# Patient Record
Sex: Male | Born: 1990 | Race: Black or African American | Hispanic: No | Marital: Single | Smoking: Never smoker
Health system: Southern US, Community
[De-identification: ages and names within clinical notes are randomized; demographics above are authoritative.]

## PROBLEM LIST (undated history)

## (undated) ENCOUNTER — Ambulatory Visit: Admission: EM

## (undated) DIAGNOSIS — R519 Headache, unspecified: Secondary | ICD-10-CM

## (undated) HISTORY — PX: EYE SURGERY: SHX253

## (undated) HISTORY — PX: HERNIA REPAIR: SHX51

---
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
## 2012-03-11 NOTE — ED Provider Notes (Addendum)
Medical screening examination/treatment/procedure(s) were conducted as a shared visit with non-physician practitioner(s) and myself.  I personally evaluated the patient during the encounter  See my additional note.  Glynn Octave, MD 03/11/12 1478  Glynn Octave, MD 03/11/12 [phone]

## 2018-10-23 ENCOUNTER — Ambulatory Visit (INDEPENDENT_AMBULATORY_CARE_PROVIDER_SITE_OTHER)
Admission: EM | Admit: 2018-10-23 | Discharge: 2018-10-23 | Disposition: A | Discharge status: Home / Self Care | Payer: BC Managed Care – PPO | Source: Home / Self Care

## 2018-10-23 ENCOUNTER — Encounter (HOSPITAL_COMMUNITY): Payer: Self-pay | Admitting: Emergency Medicine

## 2018-10-23 ENCOUNTER — Other Ambulatory Visit: Payer: Self-pay

## 2018-10-23 ENCOUNTER — Emergency Department (HOSPITAL_COMMUNITY)
Admission: EM | Admit: 2018-10-23 | Discharge: 2018-10-23 | Disposition: A | Discharge status: Home / Self Care | Payer: BC Managed Care – PPO | Attending: Emergency Medicine | Admitting: Emergency Medicine

## 2018-10-23 ENCOUNTER — Emergency Department (HOSPITAL_COMMUNITY): Payer: BC Managed Care – PPO

## 2018-10-23 DIAGNOSIS — R9431 Abnormal electrocardiogram [ECG] [EKG]: Secondary | ICD-10-CM

## 2018-10-23 DIAGNOSIS — R0989 Other specified symptoms and signs involving the circulatory and respiratory systems: Secondary | ICD-10-CM | POA: Diagnosis not present

## 2018-10-23 DIAGNOSIS — R079 Chest pain, unspecified: Secondary | ICD-10-CM | POA: Diagnosis not present

## 2018-10-23 DIAGNOSIS — K219 Gastro-esophageal reflux disease without esophagitis: Secondary | ICD-10-CM | POA: Insufficient documentation

## 2018-10-23 DIAGNOSIS — R0789 Other chest pain: Secondary | ICD-10-CM | POA: Insufficient documentation

## 2018-10-23 LAB — CBC
HCT: 45 % (ref 39.0–52.0)
Hemoglobin: 15.5 g/dL (ref 13.0–17.0)
MCH: 27.4 pg (ref 26.0–34.0)
MCHC: 34.4 g/dL (ref 30.0–36.0)
MCV: 79.6 fL — ABNORMAL LOW (ref 80.0–100.0)
Platelets: 234 10*3/uL (ref 150–400)
RBC: 5.65 MIL/uL (ref 4.22–5.81)
RDW: 11.5 % (ref 11.5–15.5)
WBC: 5.6 10*3/uL (ref 4.0–10.5)
nRBC: 0 % (ref 0.0–0.2)

## 2018-10-23 LAB — BASIC METABOLIC PANEL
Anion gap: 10 (ref 5–15)
BUN: 11 mg/dL (ref 6–20)
CO2: 22 mmol/L (ref 22–32)
Calcium: 9.3 mg/dL (ref 8.9–10.3)
Chloride: 102 mmol/L (ref 98–111)
Creatinine, Ser: 1.19 mg/dL (ref 0.61–1.24)
GFR calc Af Amer: >60 mL/min (ref >60)
GFR calc non Af Amer: >60 mL/min (ref >60)
Glucose, Bld: 88 mg/dL (ref 70–99)
Potassium: 4.1 mmol/L (ref 3.5–5.1)
Sodium: 134 mmol/L — ABNORMAL LOW (ref 135–145)

## 2018-10-23 LAB — TROPONIN I (HIGH SENSITIVITY)
Troponin I (High Sensitivity): 6 ng/L (ref <18)
Troponin I (High Sensitivity): 6 ng/L (ref <18)

## 2018-10-23 MED ORDER — SODIUM CHLORIDE 0.9% FLUSH: 3.0000 mL | Freq: Once | INTRAVENOUS | Status: DC

## 2018-10-23 MED ORDER — ALUM & MAG HYDROXIDE-SIMETH 400-400-40 MG/5ML PO SUSP: 5.0000 mL | Freq: Four times a day (QID) | ORAL | 0 refills | Status: DC | PRN

## 2018-10-23 MED ORDER — ALUM & MAG HYDROXIDE-SIMETH 200-200-20 MG/5ML PO SUSP
30.0000 mL | Freq: Once | ORAL | Status: AC
  Administered 2018-10-23: 30 mL via ORAL
  Filled 2018-10-23: qty 30

## 2018-10-23 MED ORDER — LIDOCAINE VISCOUS HCL 2 % MT SOLN
15.0000 mL | Freq: Once | OROMUCOSAL | Status: AC
  Administered 2018-10-23: 15 mL via ORAL
  Filled 2018-10-23: qty 15

## 2018-10-23 NOTE — Discharge Instructions (Signed)
Patient transported to ER for further evaluation via EMS in stable condition.

## 2018-10-23 NOTE — ED Notes (Signed)
EMS notified for transfer to ED for further evaluation

## 2018-10-23 NOTE — ED Provider Notes (Signed)
[facility]    CSN: [id number] Arrival date & time: 10/23/18  1447     History   Chief Complaint Chief Complaint  Patient presents with  . Chest Pain    HPI Daniel Golden is a 28 y.o. male with history of premature birth (6 months gestation), ventral hernia, GERD presenting for 3-day course of constant chest pain, dyspnea, diaphoresis, nausea, heart racing, fatigue without exacerbating or alleviating factors.  Patient has not been taking his GERD medications as prescribed and has been having some reflux as well.  Patient denies alcohol or tobacco use, endorses infrequent marijuana use.  Patient denies similar episodes previous to this, family history of cardiopulmonary disease.  Patient has been taking Benadryl and allergy medications daily for seasonal allergies.  Denies recent illness, fever.    History reviewed. No pertinent past medical history.  There are no active problems to display for this patient.   History reviewed. No pertinent surgical history.     Home Medications    Prior to Admission medications   Medication Sig Start Date End Date Taking? Authorizing Provider  omeprazole (PRILOSEC) 20 MG capsule Take 20 mg by mouth daily.   Yes [provider]  acetaminophen (TYLENOL) 325 MG tablet Take 650 mg by mouth every 6 (six) hours as needed. Pain    [provider]  ibuprofen (ADVIL,MOTRIN) 800 MG tablet Take 1 tablet (800 mg total) by mouth 3 (three) times daily. 12/23/11   Oliveri, Tia L, PA-C  ondansetron (ZOFRAN) 4 MG tablet Take 1 tablet (4 mg total) by mouth every 8 (eight) hours as needed for nausea. 12/23/11   Kathyrn Lass, PA-C    Family History No family history on file.  Social History Social History   Tobacco Use  . Smoking status: Never Smoker  . Smokeless tobacco: Never Used  Substance Use Topics  . Alcohol use: No  . Drug use: No     Allergies   Patient has no known allergies.   Review of Systems  Review of Systems  Constitutional: Positive for diaphoresis and fatigue. Negative for fever.  Respiratory: Positive for shortness of breath. Negative for cough.   Cardiovascular: Positive for chest pain. Negative for palpitations.  Gastrointestinal: Positive for nausea. Negative for abdominal pain, diarrhea and vomiting.  Musculoskeletal: Negative for arthralgias and myalgias.  Skin: Negative for rash and wound.  Neurological: Positive for weakness. Negative for speech difficulty and headaches.  All other systems reviewed and are negative.    Physical Exam Triage Vital Signs ED Triage Vitals  Enc Vitals Group     BP 10/23/18 1457 (!) 146/102     Pulse Rate 10/23/18 1457 (!) 58     Resp 10/23/18 1457 20     Temp 10/23/18 1457 98.3 F (36.8 C)     Temp Source 10/23/18 1457 Oral     SpO2 10/23/18 1457 98 %     Weight --      Height --      Head Circumference --      Peak Flow --      Pain Score 10/23/18 1458 0     Pain Loc --      Pain Edu? --      Excl. in [place]? --    No data found.  Updated Vital Signs BP (!) 146/102 (BP Location: Left Arm)   Pulse (!) 58   Temp 98.3 F (36.8 C) (Oral)   Resp 20   SpO2 98%  Visual Acuity Right Eye Distance:   Left Eye Distance:   Bilateral Distance:    Right Eye Near:   Left Eye Near:    Bilateral Near:     Physical Exam Constitutional:      General: He is not in acute distress.    Comments: Mildly diaphoretic  HENT:     Head: Normocephalic and atraumatic.  Eyes:     General: No scleral icterus.    Pupils: Pupils are equal, round, and reactive to light.  Neck:     Musculoskeletal: Neck supple.     Thyroid: No thyromegaly.     Vascular: No JVD.     Trachea: No tracheal deviation.  Cardiovascular:     Rate and Rhythm: Normal rate and regular rhythm.     Pulses:          Carotid pulses are 2+ on the right side and 2+ on the left side.      Radial pulses are 2+ on the right side and 2+ on the left side.       Dorsalis  pedis pulses are 2+ on the right side and 2+ on the left side.     Heart sounds: Normal heart sounds.  Pulmonary:     Effort: Pulmonary effort is normal. No respiratory distress.     Breath sounds: Normal breath sounds.  Chest:     Chest wall: No tenderness or crepitus.  Musculoskeletal:     Right lower leg: No edema.     Left lower leg: No edema.  Lymphadenopathy:     Cervical: No cervical adenopathy.  Skin:    General: Skin is warm.     Coloration: Skin is not cyanotic, jaundiced or pale.  Neurological:     General: No focal deficit present.     Mental Status: He is alert and oriented to person, place, and time.  Psychiatric:        Mood and Affect: Mood normal.        Behavior: Behavior normal.      UC Treatments / Results  Labs (all labs ordered are listed, but only abnormal results are displayed) Labs Reviewed - No data to display  EKG   Radiology No results found.  Procedures Procedures (including critical care time)  Medications Ordered in UC Medications - No data to display  Initial Impression / Assessment and Plan / UC Course  I have reviewed the triage vital signs and the nursing notes.  Pertinent labs & imaging results that were available during my care of the patient were reviewed by me and considered in my medical decision making (see chart for details).     1.  Chest pain with abnormal EKG Patient mildly diaphoretic, hypertensive in office.  EKG done in office without previous to compare, reviewed by me and consulted with Wallis BambergMario Mani, PA-C: Normal sinus rhythm with sinus arrhythmia.  No QTC prolongation, though significant ST elevation in lead V1 with T wave inversions in leads II, III, and aVF.  T wave inversions in leads V5, V6.  Patient given 324 mg chewable aspirin, IV access obtained by clinical staff which patient tolerated well.  Patient transported to ER for further cardiac work-up by EMS stable condition. Final Clinical Impressions(s) / UC  Diagnoses   Final diagnoses:  Chest pain, unspecified type  Abnormal EKG     Discharge Instructions     Patient transported to ER for further evaluation via EMS in stable condition.    ED Prescriptions  None     Controlled Substance Prescriptions [place] Controlled Substance Registry consulted? Not Applicable   Shea EvansHall-Potvin, Brittany, [place] JerseyPA-C 10/23/18 1609

## 2018-10-23 NOTE — ED Triage Notes (Signed)
Pt c/o lt sided chest pain with SOB x3 days, [facility] and had [facility] neg [facility] test. States dx'd with acid reflux and allergies

## 2018-10-23 NOTE — ED Notes (Signed)
Patient Alert and oriented to baseline. Stable and ambulatory to baseline. Patient verbalized understanding of the discharge instructions.  Patient belongings were taken by the patient.   

## 2018-10-23 NOTE — Discharge Instructions (Signed)
Take omeprazole daily.  Use maalox as needed for breakthrough pain or before bed.  Avoid spicy, greasy, acidic foods.  There is information about this in the paperwork. Avoid anti-inflammatory such as ibuprofen.  You may take Tylenol as needed for pain. Do not eat or drink 60 minutes prior to laying flat. Follow-up with your primary care doctor as needed for further evaluation of your symptoms. Return to the emergency room with any new, worsening, concerning symptoms.

## 2018-10-23 NOTE — ED Provider Notes (Signed)
[name] Provider Note   CSN: [id number] Arrival date & time: 10/23/18  1614    History   Chief Complaint Chief Complaint  Patient presents with  . Chest Pain    HPI Daniel Golden is a 28 y.o. male resenting for evaluation of chest pain.  Patient states for the past 5 days he has been having intermittent left-sided chest pain.  He describes as a chest tightness it.  Is present when he lays flat, no symptoms during the day or with exertion.  He reports associated feeling like his heart is racing and his palms are sweaty.  Symptoms always resolve when he sits up and drinks water.  He reports associated intermittent globus sensation when eating.  Pt state he has been eating a lot of greasy/fatty foods recently, and often eat just before going to bed. He denies associated fevers, chills, shortness of breath.  No radiation of the pain.  He denies nausea or vomiting.  He denies recent viral symptoms.  He was tested several days ago for coronavirus when his symptoms began, results were negative.  He was given omeprazole, which he took yesterday with improvement of his symptoms, however when his symptoms returned today he sought further evaluation.  He was seen at urgent care where his EKG was abnormal, and thus sent to the ER for further evaluation.  He denies a history of reflux previously.  He has not taken anything for symptoms today.  He denies significant NSAID use.  He denies family cardiac history.  He denies history of hypertension or diabetes.  He denies tobacco use.  He denies recent travel, surgeries, immobilization, history of cancer, history of previous DVT/PE.     HPI  No past medical history on file.  There are no active problems to display for this patient.   Past Surgical History:  Procedure Laterality Date  . EYE SURGERY    . HERNIA REPAIR          Home Medications    Prior to Admission medications   Medication Sig Start  Date End Date Taking? Authorizing Provider  acetaminophen (TYLENOL) 325 MG tablet Take 650 mg by mouth every 6 (six) hours as needed. Pain    [provider]  alum & mag hydroxide-simeth (MAALOX MAX) 400-400-40 MG/5ML suspension Take 5 mLs by mouth every 6 (six) hours as needed for indigestion. 10/23/18   Theressa Piedra, PA-C  ibuprofen (ADVIL,MOTRIN) 800 MG tablet Take 1 tablet (800 mg total) by mouth 3 (three) times daily. 12/23/11   Oliveri, Tia L, PA-C  omeprazole (PRILOSEC) 20 MG capsule Take 20 mg by mouth daily.    [provider]  ondansetron (ZOFRAN) 4 MG tablet Take 1 tablet (4 mg total) by mouth every 8 (eight) hours as needed for nausea. 12/23/11   Riki Sheerliveri, Tia L, PA-C    Family History Family History  Problem Relation Age of Onset  . Diabetes Mother   . Heart failure Mother   . Diabetes Father   . Heart failure Father     Social History Social History   Tobacco Use  . Smoking status: Never Smoker  . Smokeless tobacco: Never Used  Substance Use Topics  . Alcohol use: No  . Drug use: No     Allergies   Patient has no known allergies.   Review of Systems Review of Systems  Cardiovascular: Positive for chest pain (intermittent).  All other systems reviewed and are negative.  Physical Exam Updated Vital Signs BP 121/86   Pulse 62   Temp 98.1 F (36.7 C) (Oral)   Resp 20   Ht 5\' 9"  (1.753 m)   Wt 68 kg   SpO2 100%   BMI 22.15 kg/m   Physical Exam Vitals signs and nursing note reviewed.  Constitutional:      General: He is not in acute distress.    Appearance: He is well-developed.     Comments: Sitting comfortably in the bed in no acute distress  HENT:     Head: Normocephalic and atraumatic.  Eyes:     Conjunctiva/sclera: Conjunctivae normal.     Pupils: Pupils are equal, round, and reactive to light.  Neck:     Musculoskeletal: Normal range of motion and neck supple.  Cardiovascular:     Rate and Rhythm: Normal rate and  regular rhythm.     Pulses: Normal pulses.  Pulmonary:     Effort: Pulmonary effort is normal. No respiratory distress.     Breath sounds: Normal breath sounds. No wheezing.     Comments: Speaking in full sentences.  Clear lung sounds in all fields. Abdominal:     General: There is no distension.     Palpations: Abdomen is soft. There is no mass.     Tenderness: There is no abdominal tenderness. There is no guarding or rebound.  Musculoskeletal: Normal range of motion.     Comments: No leg pain or swelling.  Skin:    General: Skin is warm and dry.     Capillary Refill: Capillary refill takes less than 2 seconds.  Neurological:     Mental Status: He is alert and oriented to person, place, and time.      ED Treatments / Results  Labs (all labs ordered are listed, but only abnormal results are displayed) Labs Reviewed  BASIC METABOLIC PANEL - Abnormal; Notable for the following components:      Result Value   Sodium 134 (*)    All other components within normal limits  CBC - Abnormal; Notable for the following components:   MCV 79.6 (*)    All other components within normal limits  TROPONIN I (HIGH SENSITIVITY)  TROPONIN I (HIGH SENSITIVITY)    EKG None  Radiology Dg Chest 2 View  Result Date: 10/23/2018 CLINICAL DATA:  Chest pain, shortness of breath and diaphoresis. EXAM: CHEST - 2 VIEW COMPARISON:  None. FINDINGS: Heart size is normal. Mediastinal shadows are normal. The lungs are clear. No bronchial thickening. No infiltrate, mass, effusion or collapse. Left hemidiaphragm slightly higher than the right, not likely significant. Pulmonary vascularity is normal. No bony abnormality. IMPRESSION: No active cardiopulmonary disease. Electronically Signed   By: Nelson Chimes M.D.   On: 10/23/2018 16:55    Procedures Procedures (including critical care time)  Medications Ordered in ED Medications  alum & mag hydroxide-simeth (MAALOX/MYLANTA) 200-200-20 MG/5ML suspension 30 mL  (30 mLs Oral Given 10/23/18 1937)    And  lidocaine (XYLOCAINE) 2 % viscous mouth solution 15 mL (15 mLs Oral Given 10/23/18 1937)     Initial Impression / Assessment and Plan / ED Course  I have reviewed the triage vital signs and the nursing notes.  Pertinent labs & imaging results that were available during my care of the patient were reviewed by me and considered in my medical decision making (see chart for details).        Presenting for evaluation of intermittent chest pain, present only when laying  flat.  Physical exam reassuring, he appears nontoxic.  No associated shortness of breath, low suspicion for CHF.  As he also reports a globus sensation and symptoms are worse when lying flat, consider GERD.  Initial work-up reassuring, no leukocytosis.  Hemoglobin stable.  Electrolytes stable.  Troponin 6.  Heart score of 2.  As such, will obtain delta troponin, but patient can likely be discharged if delta is less than 5.  X-ray viewed interpreted by me, no pneumonia, pneumothorax, effusion, cardiomegaly.  EKG reviewed by myself and Dr. Deretha EmoryZackowski.  Likely early re-pol/isolated ST changes in V1.   Repeat troponin remains 6.  Patient given GI cocktail, and bed laid flat.  No recurrence of symptoms.  As such, likely GI etiology.  Discussed with patient.  Discussed importance of diet.  Discussed use of omeprazole and Maalox as needed.  Encourage follow-up with primary care as needed for further evaluation.  At this time, patient appears safe for discharge.  Return precautions given.  Patient states he understands and agrees to plan.   Final Clinical Impressions(s) / ED Diagnoses   Final diagnoses:  Atypical chest pain  Gastroesophageal reflux disease, esophagitis presence not specified    ED Discharge Orders         Ordered    alum & mag hydroxide-simeth (MAALOX MAX) 400-400-40 MG/5ML suspension  Every 6 hours PRN     10/23/18 2153           Alveria ApleyCaccavale, Lex Linhares, PA-C 10/24/18 0133     Vanetta MuldersZackowski, Scott, MD 10/27/18 [phone]

## 2018-10-23 NOTE — ED Triage Notes (Signed)
Came in from urgent care; being seen for CP; and reported has some EKG abnormalities.EMS reported relief from NTG  X 1as well.

## 2018-10-25 ENCOUNTER — Encounter (HOSPITAL_COMMUNITY): Payer: Self-pay | Admitting: Emergency Medicine

## 2018-10-25 ENCOUNTER — Other Ambulatory Visit: Payer: Self-pay

## 2018-10-25 ENCOUNTER — Emergency Department (HOSPITAL_COMMUNITY)
Admission: EM | Admit: 2018-10-25 | Discharge: 2018-10-25 | Disposition: A | Discharge status: Home / Self Care | Payer: BC Managed Care – PPO | Attending: Emergency Medicine | Admitting: Emergency Medicine

## 2018-10-25 DIAGNOSIS — K2970 Gastritis, unspecified, without bleeding: Secondary | ICD-10-CM | POA: Insufficient documentation

## 2018-10-25 DIAGNOSIS — R079 Chest pain, unspecified: Secondary | ICD-10-CM | POA: Diagnosis present

## 2018-10-25 MED ORDER — FAMOTIDINE 20 MG PO TABS: 20.0000 mg | ORAL_TABLET | Freq: Two times a day (BID) | ORAL | 0 refills | Status: DC

## 2018-10-25 MED ORDER — FAMOTIDINE 20 MG PO TABS
20.0000 mg | ORAL_TABLET | Freq: Once | ORAL | Status: AC
  Administered 2018-10-25: 16:00:00 20 mg via ORAL
  Filled 2018-10-25: qty 1

## 2018-10-25 NOTE — ED Triage Notes (Signed)
Pt. Stated, I have acid reflux, I was here on Saturday for the same and was given medication for the reflux, it  Works sometimes and sometimes not.

## 2018-10-25 NOTE — Discharge Instructions (Addendum)
Please read attached information. If you experience any new or worsening signs or symptoms please return to the emergency room for evaluation. Please follow-up with your primary care provider or specialist as discussed. Please use medication prescribed only as directed and discontinue taking if you have any concerning signs or symptoms.   °

## 2018-10-25 NOTE — ED Provider Notes (Signed)
[name] Provider Note   CSN: [id number] Arrival date & time: 10/25/18  1358    History   Chief Complaint Chief Complaint  Patient presents with  . Gastroesophageal Reflux    HPI Daniel Golden is a 28 y.o. male.     HPI   28 year old male presents today with complaints of chest and abdominal pain.  Patient has a week long history of intermittent chest and abdominal discomfort.  He notes tightness in his chest that was originally left now it is on the right.  He notes that when he wakes up in the middle the night he has racing of his heart.  He denies any indigestion lower abdominal pain nausea or vomiting.  He notes food does not make his symptoms worse.  He notes recent COVID test that was negative.  He was seen in the emergency room 2 days ago where he was given Maalox and Prilosec which improved his symptoms.  He notes he took another dose of both this morning that did not improve his symptoms.  He notes he has not had a bowel movement in several days but is passing gas.  Denies any history DVT or PE.  No personal family cardiac history, no personal history of hypertension and diabetes.  He does not smoke.    History reviewed. No pertinent past medical history.  There are no active problems to display for this patient.   Past Surgical History:  Procedure Laterality Date  . EYE SURGERY    . HERNIA REPAIR         Home Medications    Prior to Admission medications   Medication Sig Start Date End Date Taking? Authorizing Provider  acetaminophen (TYLENOL) 325 MG tablet Take 650 mg by mouth every 6 (six) hours as needed. Pain    [provider]  alum & mag hydroxide-simeth (MAALOX MAX) 400-400-40 MG/5ML suspension Take 5 mLs by mouth every 6 (six) hours as needed for indigestion. 10/23/18   Caccavale, Sophia, PA-C  famotidine (PEPCID) 20 MG tablet Take 1 tablet (20 mg total) by mouth 2 (two) times daily. 10/25/18   Josseline Reddin,  Tinnie GensJeffrey, PA-C  ibuprofen (ADVIL,MOTRIN) 800 MG tablet Take 1 tablet (800 mg total) by mouth 3 (three) times daily. 12/23/11   Oliveri, Tia L, PA-C  omeprazole (PRILOSEC) 20 MG capsule Take 20 mg by mouth daily.    [provider]  ondansetron (ZOFRAN) 4 MG tablet Take 1 tablet (4 mg total) by mouth every 8 (eight) hours as needed for nausea. 12/23/11   Riki Sheerliveri, Tia L, PA-C    Family History Family History  Problem Relation Age of Onset  . Diabetes Mother   . Heart failure Mother   . Diabetes Father   . Heart failure Father     Social History Social History   Tobacco Use  . Smoking status: Never Smoker  . Smokeless tobacco: Never Used  Substance Use Topics  . Alcohol use: No  . Drug use: No     Allergies   Patient has no known allergies.   Review of Systems Review of Systems  All other systems reviewed and are negative.   Physical Exam Updated Vital Signs BP (!) 145/93 (BP Location: Right Arm)   Pulse 83   Temp 98.5 F (36.9 C) (Oral)   Resp 18   Ht 5\' 9"  (1.753 m)   Wt 68 kg   SpO2 100%   BMI 22.15 kg/m   Physical Exam  Vitals signs and nursing note reviewed.  Constitutional:      Appearance: He is well-developed.  HENT:     Head: Normocephalic and atraumatic.  Eyes:     General: No scleral icterus.       Right eye: No discharge.        Left eye: No discharge.     Conjunctiva/sclera: Conjunctivae normal.     Pupils: Pupils are equal, round, and reactive to light.  Neck:     Musculoskeletal: Normal range of motion.     Vascular: No JVD.     Trachea: No tracheal deviation.  Pulmonary:     Effort: Pulmonary effort is normal.     Breath sounds: No stridor.  Neurological:     Mental Status: He is alert and oriented to person, place, and time.     Coordination: Coordination normal.  Psychiatric:        Behavior: Behavior normal.        Thought Content: Thought content normal.        Judgment: Judgment normal.     ED Treatments / Results   Labs (all labs ordered are listed, but only abnormal results are displayed) Labs Reviewed - No data to display  EKG None  Radiology Dg Chest 2 View  Result Date: 10/23/2018 CLINICAL DATA:  Chest pain, shortness of breath and diaphoresis. EXAM: CHEST - 2 VIEW COMPARISON:  None. FINDINGS: Heart size is normal. Mediastinal shadows are normal. The lungs are clear. No bronchial thickening. No infiltrate, mass, effusion or collapse. Left hemidiaphragm slightly higher than the right, not likely significant. Pulmonary vascularity is normal. No bony abnormality. IMPRESSION: No active cardiopulmonary disease. Electronically Signed   By: Nelson Chimes M.D.   On: 10/23/2018 16:55    Procedures Procedures (including critical care time)  Medications Ordered in ED Medications  famotidine (PEPCID) tablet 20 mg (20 mg Oral Given 10/25/18 1546)     Initial Impression / Assessment and Plan / ED Course  I have reviewed the triage vital signs and the nursing notes.  Pertinent labs & imaging results that were available during my care of the patient were reviewed by me and considered in my medical decision making (see chart for details).        Assessment/Plan: 28 year old male presents today with indigestion and chest pain.  He said weeklong symptoms, had recent cardiac work-up with no significant abnormalities.  Very low suspicion for ACS, dissection, PE, infection or any other life-threatening etiology.  Patient does have symptoms consistent with anxiety as well.  I do have some suspicion for indigestion as his symptoms improved with Prilosec and Maalox previously.  Patient will be instructed to use Pepcid, if this does not work he will continue using Prilosec, follow-up as an outpatient.  Is given strict return precautions.  Verbalized understanding and agreement to today's plan had no further questions or concerns at time of discharge.   Final Clinical Impressions(s) / ED Diagnoses   Final diagnoses:   Gastritis without bleeding, unspecified chronicity, unspecified gastritis type    ED Discharge Orders         Ordered    famotidine (PEPCID) 20 MG tablet  2 times daily     10/25/18 1546           Okey Regal, PA-C 10/25/18 [place], [place], DO 10/26/18 [phone]

## 2018-10-25 NOTE — ED Notes (Signed)
Patient verbalizes understanding of discharge instructions. Opportunity for questioning and answers were provided. Armband removed by staff, pt discharged from ED.  

## 2018-10-27 ENCOUNTER — Telehealth: Payer: Self-pay | Admitting: Emergency Medicine

## 2018-10-27 NOTE — Telephone Encounter (Signed)
Patient called the medication question.  He states he went to the ER from urgent care: Diagnosed with GERD.  Patient went to ER few days later after symptoms not resolving with just omeprazole.  Patient states he was given famotidine and Maalox at the second appointment.  Patient is concerned that taking all 3 might cause an allergic reaction.  Patient states that his discomfort has dissipated since using all 3.  Provided reassurance to patient satisfaction that there should not be an allergic reaction, that he will not "overdose "as he had voiced concerned.  Patient has a PCP, will call today to schedule an appointment for ER follow-up.

## 2018-10-28 ENCOUNTER — Other Ambulatory Visit: Payer: Self-pay

## 2018-10-28 ENCOUNTER — Observation Stay (HOSPITAL_COMMUNITY)
Admission: EM | Admit: 2018-10-28 | Discharge: 2018-10-29 | Disposition: A | Discharge status: Home / Self Care | Payer: BC Managed Care – PPO | Attending: Cardiovascular Disease | Admitting: Cardiovascular Disease

## 2018-10-28 ENCOUNTER — Encounter (HOSPITAL_COMMUNITY): Payer: Self-pay | Admitting: *Deleted

## 2018-10-28 ENCOUNTER — Observation Stay (HOSPITAL_BASED_OUTPATIENT_CLINIC_OR_DEPARTMENT_OTHER): Payer: BC Managed Care – PPO

## 2018-10-28 DIAGNOSIS — R079 Chest pain, unspecified: Secondary | ICD-10-CM | POA: Diagnosis not present

## 2018-10-28 DIAGNOSIS — R9431 Abnormal electrocardiogram [ECG] [EKG]: Secondary | ICD-10-CM

## 2018-10-28 DIAGNOSIS — F1210 Cannabis abuse, uncomplicated: Secondary | ICD-10-CM | POA: Diagnosis not present

## 2018-10-28 DIAGNOSIS — Z8249 Family history of ischemic heart disease and other diseases of the circulatory system: Secondary | ICD-10-CM | POA: Insufficient documentation

## 2018-10-28 DIAGNOSIS — R002 Palpitations: Secondary | ICD-10-CM | POA: Diagnosis present

## 2018-10-28 DIAGNOSIS — K219 Gastro-esophageal reflux disease without esophagitis: Secondary | ICD-10-CM | POA: Insufficient documentation

## 2018-10-28 DIAGNOSIS — Z87891 Personal history of nicotine dependence: Secondary | ICD-10-CM | POA: Diagnosis not present

## 2018-10-28 DIAGNOSIS — Z1159 Encounter for screening for other viral diseases: Secondary | ICD-10-CM | POA: Insufficient documentation

## 2018-10-28 DIAGNOSIS — I471 Supraventricular tachycardia: Secondary | ICD-10-CM | POA: Diagnosis not present

## 2018-10-28 LAB — CBC WITH DIFFERENTIAL/PLATELET
Abs Immature Granulocytes: 0.01 10*3/uL (ref 0.00–0.07)
Basophils Absolute: 0 10*3/uL (ref 0.0–0.1)
Basophils Relative: 1 %
Eosinophils Absolute: 0 10*3/uL (ref 0.0–0.5)
Eosinophils Relative: 1 %
HCT: 51 % (ref 39.0–52.0)
Hemoglobin: 16.8 g/dL (ref 13.0–17.0)
Immature Granulocytes: 0 %
Lymphocytes Relative: 25 %
Lymphs Abs: 1.3 10*3/uL (ref 0.7–4.0)
MCH: 27.4 pg (ref 26.0–34.0)
MCHC: 32.9 g/dL (ref 30.0–36.0)
MCV: 83.1 fL (ref 80.0–100.0)
Monocytes Absolute: 0.7 10*3/uL (ref 0.1–1.0)
Monocytes Relative: 14 %
Neutro Abs: 3.2 10*3/uL (ref 1.7–7.7)
Neutrophils Relative %: 59 %
Platelets: 266 10*3/uL (ref 150–400)
RBC: 6.14 MIL/uL — ABNORMAL HIGH (ref 4.22–5.81)
RDW: 11.8 % (ref 11.5–15.5)
WBC: 5.3 10*3/uL (ref 4.0–10.5)
nRBC: 0 % (ref 0.0–0.2)

## 2018-10-28 LAB — BASIC METABOLIC PANEL
Anion gap: 17 — ABNORMAL HIGH (ref 5–15)
BUN: 12 mg/dL (ref 6–20)
CO2: 22 mmol/L (ref 22–32)
Calcium: 9.9 mg/dL (ref 8.9–10.3)
Chloride: 100 mmol/L (ref 98–111)
Creatinine, Ser: 1.28 mg/dL — ABNORMAL HIGH (ref 0.61–1.24)
GFR calc Af Amer: >60 mL/min (ref >60)
GFR calc non Af Amer: >60 mL/min (ref >60)
Glucose, Bld: 69 mg/dL — ABNORMAL LOW (ref 70–99)
Potassium: 4.6 mmol/L (ref 3.5–5.1)
Sodium: 139 mmol/L (ref 135–145)

## 2018-10-28 LAB — RAPID URINE DRUG SCREEN, HOSP PERFORMED
Amphetamines: NOT DETECTED
Barbiturates: NOT DETECTED
Benzodiazepines: NOT DETECTED
Cocaine: NOT DETECTED
Opiates: NOT DETECTED
Tetrahydrocannabinol: POSITIVE — AB

## 2018-10-28 LAB — CBC
HCT: 46 % (ref 39.0–52.0)
Hemoglobin: 15.6 g/dL (ref 13.0–17.0)
MCH: 27.7 pg (ref 26.0–34.0)
MCHC: 33.9 g/dL (ref 30.0–36.0)
MCV: 81.6 fL (ref 80.0–100.0)
Platelets: 253 10*3/uL (ref 150–400)
RBC: 5.64 MIL/uL (ref 4.22–5.81)
RDW: 11.8 % (ref 11.5–15.5)
WBC: 4.8 10*3/uL (ref 4.0–10.5)
nRBC: 0 % (ref 0.0–0.2)

## 2018-10-28 LAB — TROPONIN I (HIGH SENSITIVITY)
Troponin I (High Sensitivity): 12 ng/L (ref <18)
Troponin I (High Sensitivity): 19 ng/L — ABNORMAL HIGH (ref <18)
Troponin I (High Sensitivity): 18 ng/L — ABNORMAL HIGH (ref <18)

## 2018-10-28 LAB — ECHOCARDIOGRAM COMPLETE
Height: 69 in
Weight: 2398.6 oz

## 2018-10-28 LAB — CREATININE, SERUM
Creatinine, Ser: 1.25 mg/dL — ABNORMAL HIGH (ref 0.61–1.24)
GFR calc Af Amer: >60 mL/min (ref >60)
GFR calc non Af Amer: >60 mL/min (ref >60)

## 2018-10-28 LAB — TSH: TSH: 0.778 u[IU]/mL (ref 0.350–4.500)

## 2018-10-28 LAB — SARS CORONAVIRUS 2 BY RT PCR (HOSPITAL ORDER, PERFORMED IN ~~LOC~~ HOSPITAL LAB): SARS Coronavirus 2: NEGATIVE

## 2018-10-28 LAB — D-DIMER, QUANTITATIVE: D-Dimer, Quant: <0.27 ug/mL-FEU (ref 0.00–0.50)

## 2018-10-28 MED ORDER — SODIUM CHLORIDE 0.9 % IV BOLUS
1000.0000 mL | Freq: Once | INTRAVENOUS | Status: AC
  Administered 2018-10-28: 1000 mL via INTRAVENOUS

## 2018-10-28 MED ORDER — ACETAMINOPHEN 325 MG PO TABS: 650.0000 mg | ORAL_TABLET | ORAL | Status: DC | PRN

## 2018-10-28 MED ORDER — SODIUM CHLORIDE 0.9% FLUSH
3.0000 mL | Freq: Two times a day (BID) | INTRAVENOUS | Status: DC
  Administered 2018-10-28 – 2018-10-29 (×2): 3 mL via INTRAVENOUS

## 2018-10-28 MED ORDER — ONDANSETRON HCL 4 MG/2ML IJ SOLN: 4.0000 mg | Freq: Four times a day (QID) | INTRAMUSCULAR | Status: DC | PRN

## 2018-10-28 MED ORDER — PANTOPRAZOLE SODIUM 40 MG PO TBEC
40.0000 mg | DELAYED_RELEASE_TABLET | Freq: Every day | ORAL | Status: DC
  Administered 2018-10-29: 40 mg via ORAL
  Filled 2018-10-28: qty 1

## 2018-10-28 MED ORDER — ASPIRIN 81 MG PO CHEW
81.0000 mg | CHEWABLE_TABLET | ORAL | Status: AC
  Administered 2018-10-29: 81 mg via ORAL
  Filled 2018-10-28: qty 1

## 2018-10-28 MED ORDER — ASPIRIN EC 81 MG PO TBEC: 81.0000 mg | DELAYED_RELEASE_TABLET | Freq: Every day | ORAL | Status: DC

## 2018-10-28 MED ORDER — SODIUM CHLORIDE 0.9 % IV SOLN: 250.0000 mL | INTRAVENOUS | Status: DC | PRN

## 2018-10-28 MED ORDER — METOPROLOL TARTRATE 25 MG PO TABS
25.0000 mg | ORAL_TABLET | Freq: Two times a day (BID) | ORAL | Status: DC
  Administered 2018-10-28: 25 mg via ORAL
  Filled 2018-10-28: qty 1

## 2018-10-28 MED ORDER — SODIUM CHLORIDE 0.9% FLUSH: 3.0000 mL | INTRAVENOUS | Status: DC | PRN

## 2018-10-28 MED ORDER — SODIUM CHLORIDE 0.9 % WEIGHT BASED INFUSION
1.0000 mL/kg/h | INTRAVENOUS | Status: DC
  Administered 2018-10-29: 1 mL/kg/h via INTRAVENOUS

## 2018-10-28 MED ORDER — FAMOTIDINE 20 MG PO TABS
20.0000 mg | ORAL_TABLET | Freq: Two times a day (BID) | ORAL | Status: DC
  Administered 2018-10-28 – 2018-10-29 (×2): 20 mg via ORAL
  Filled 2018-10-28 (×2): qty 1

## 2018-10-28 MED ORDER — SODIUM CHLORIDE 0.9 % WEIGHT BASED INFUSION
3.0000 mL/kg/h | INTRAVENOUS | Status: DC
  Administered 2018-10-29: 3 mL/kg/h via INTRAVENOUS

## 2018-10-28 MED ORDER — NITROGLYCERIN 0.4 MG SL SUBL: 0.4000 mg | SUBLINGUAL_TABLET | SUBLINGUAL | Status: DC | PRN

## 2018-10-28 MED ORDER — HEPARIN SODIUM (PORCINE) 5000 UNIT/ML IJ SOLN
5000.0000 [IU] | Freq: Three times a day (TID) | INTRAMUSCULAR | Status: DC
  Administered 2018-10-28 – 2018-10-29 (×2): 5000 [IU] via SUBCUTANEOUS
  Filled 2018-10-28 (×2): qty 1

## 2018-10-28 NOTE — ED Notes (Signed)
Attempted to call report.  Nurse unavailable. 

## 2018-10-28 NOTE — ED Provider Notes (Signed)
[name] Note   CSN: [id number] Arrival date & time: 10/28/18  0903     History   Chief Complaint Chief Complaint  Patient presents with  . Irregular Heart Beat    HPI Lockie Pareshillip P Schow is a 28 y.o. male.     HPI   Patient is a 28 year old male with no significant past medical history presenting for palpitations and central chest pain.  Patient reports that the symptoms all began within the last week.  He has had multiple emergency department visits in the last week due to these ongoing symptoms.  He reports that when his symptoms began a week ago he was having sharp pain in the center of the chest that would be mostly nonexertional and wake him up at night.  He will also feel rapid "thumping" of his heart.  Would feel short of breath at the time but otherwise would not have shortness of breath at other times when not experiencing the symptoms.  He reports the chest pain is improved but he is still waking up with bothersome palpitations in the night.  These episodes make him lightheaded.  He denies nausea or vomiting.  Patient denies any recent illnesses and was tested for COVID-19 a couple weeks ago and it was negative.  Patient denies any history of DVT/PE, hormone use, cancer treatment, recent immobilization, hospitalization, recent surgery, lower extremity edema or calf tenderness or hemoptysis.  Patient has a family history of CAD in his grandfather at age 26 otherwise no heart conditions in the family. Patient reports that he quit cigarettes, marijuana, and drinking [employer] in the last 2 weeks.  History reviewed. No pertinent past medical history.  There are no active problems to display for this patient.   Past Surgical History:  Procedure Laterality Date  . EYE SURGERY    . HERNIA REPAIR          Home Medications    Prior to Admission medications   Medication Sig Start Date End Date Taking? Authorizing Provider   acetaminophen (TYLENOL) 325 MG tablet Take 650 mg by mouth every 6 (six) hours as needed. Pain   Yes [provider]  alum & mag hydroxide-simeth (MAALOX MAX) 400-400-40 MG/5ML suspension Take 5 mLs by mouth every 6 (six) hours as needed for indigestion. 10/23/18  Yes Caccavale, Sophia, PA-C  famotidine (PEPCID) 20 MG tablet Take 1 tablet (20 mg total) by mouth 2 (two) times daily. 10/25/18  Yes Hedges, Tinnie GensJeffrey, PA-C  omeprazole (PRILOSEC) 20 MG capsule Take 20 mg by mouth daily.   Yes [provider]  ZYRTEC-D ALLERGY & CONGESTION 5-120 MG tablet Take 1 tablet by mouth every 12 (twelve) hours. 10/22/18  Yes [provider]  ibuprofen (ADVIL,MOTRIN) 800 MG tablet Take 1 tablet (800 mg total) by mouth 3 (three) times daily. Patient not taking: Reported on 10/28/2018 12/23/11   Pixie Casinoliveri, Tia L, PA-C  ondansetron (ZOFRAN) 4 MG tablet Take 1 tablet (4 mg total) by mouth every 8 (eight) hours as needed for nausea. Patient not taking: Reported on 10/28/2018 12/23/11   Riki Sheerliveri, Tia L, PA-C    Family History Family History  Problem Relation Age of Onset  . Diabetes Mother   . Heart failure Mother   . Diabetes Father   . Heart failure Father     Social History Social History   Tobacco Use  . Smoking status: Never Smoker  . Smokeless tobacco: Never Used  Substance Use Topics  .  Alcohol use: No  . Drug use: No     Allergies   Patient has no known allergies.   Review of Systems Review of Systems  Constitutional: Negative for chills and fever.  HENT: Negative for congestion, rhinorrhea, sinus pain and sore throat.   Eyes: Negative for visual disturbance.  Respiratory: Positive for shortness of breath. Negative for cough and chest tightness.   Cardiovascular: Positive for chest pain and palpitations. Negative for leg swelling.  Gastrointestinal: Negative for abdominal pain, constipation, diarrhea, nausea and vomiting.  Genitourinary: Negative for dysuria and flank pain.   Musculoskeletal: Negative for back pain and myalgias.  Skin: Negative for rash.  Neurological: Positive for light-headedness. Negative for dizziness, syncope and headaches.     Physical Exam Updated Vital Signs BP (!) 142/87   Pulse 84   Temp 98.5 F (36.9 C) (Oral)   Resp 13   SpO2 100%   Physical Exam Vitals signs and nursing note reviewed.  Constitutional:      General: He is not in acute distress.    Appearance: He is well-developed.  HENT:     Head: Normocephalic and atraumatic.  Eyes:     Conjunctiva/sclera: Conjunctivae normal.     Pupils: Pupils are equal, round, and reactive to light.  Neck:     Musculoskeletal: Normal range of motion and neck supple.  Cardiovascular:     Rate and Rhythm: Normal rate and regular rhythm.     Pulses: Normal pulses.     Heart sounds: Normal heart sounds, S1 normal and S2 normal. No murmur.  Pulmonary:     Effort: Pulmonary effort is normal.     Breath sounds: Normal breath sounds. No wheezing or rales.  Abdominal:     General: There is no distension.     Palpations: Abdomen is soft.     Tenderness: There is no abdominal tenderness. There is no guarding.  Musculoskeletal: Normal range of motion.        General: No swelling, tenderness or deformity.     Right lower leg: No edema.     Left lower leg: No edema.  Lymphadenopathy:     Cervical: No cervical adenopathy.  Skin:    General: Skin is warm and dry.     Findings: No erythema or rash.  Neurological:     Mental Status: He is alert.     Comments: Cranial nerves grossly intact. Patient moves extremities symmetrically and with good coordination.  Psychiatric:        Behavior: Behavior normal.        Thought Content: Thought content normal.        Judgment: Judgment normal.      ED Treatments / Results  Labs (all labs ordered are listed, but only abnormal results are displayed) Labs Reviewed  BASIC METABOLIC PANEL - Abnormal; Notable for the following components:       Result Value   Glucose, Bld 69 (*)    Creatinine, Ser 1.28 (*)    Anion gap 17 (*)    All other components within normal limits  CBC WITH DIFFERENTIAL/PLATELET - Abnormal; Notable for the following components:   RBC 6.14 (*)    All other components within normal limits  SARS CORONAVIRUS 2 (HOSPITAL ORDER, PERFORMED IN [facility])  D-DIMER, QUANTITATIVE (NOT AT [facility])  RAPID URINE DRUG SCREEN, HOSP PERFORMED  TROPONIN I (HIGH SENSITIVITY)    EKG EKG Interpretation  Date/Time:  Thursday October 28 2018 13:30:38 EDT Ventricular Rate:  70 PR Interval:    QRS Duration: 87 QT Interval:  387 QTC Calculation: 418 R Axis:   29 Text Interpretation:  Sinus rhythm Borderline short PR interval LVH by voltage similar to prior EKG Confirmed by Malvin Johns [phone]) on 10/28/2018 1:35:59 PM   Radiology No results found.  Procedures Procedures (including critical care time)  Medications Ordered in ED Medications - No data to display   Initial Impression / Assessment and Plan / ED Course  I have reviewed the triage vital signs and the nursing notes.  Pertinent labs & imaging results that were available during my care of the patient were reviewed by me and considered in my medical decision making (see chart for details).  Clinical Course as of Oct 28 1319  Thu Oct 28, 2018  1304 D-Dimer, Quant: <0.27 [AM]  1320 Suspect volume depletion as cause.  Will give fluids.  Patient specifically denies any salicylate use, alcohol use, toxic alcohol use, or other new medications or substances. Co2 is normal and do not suspect AG metabolic acidosis.  Anion gap(!): 17 [AM]    Clinical Course User Index [AM] Albesa Seen, PA-C       This is a well-appearing 28 year old male with no significant past medical history presenting for ongoing palpitations and chest discomfort.  Primarily occur at night and at rest.  Overall presentation atypical for ACS and patient did recently have  chest pain rule out 4 days ago with negative troponins x2.  EKG on initial evaluation was a normal sinus rhythm with some ST depressions inferiorly.  Symptoms are also atypical for dangerous arrhythmias, as they occur not exertionally, patient does not have syncope or near syncope when they occur, has no history of exertional syncope.  Patient was treated for GERD which seems to be improving his symptoms however the palpitations persist.  Given ongoing symptoms, will assess d-dimer.    EKG today shows some slight worsening of the inferior depressions, but overall consistent with prior EKG on 10-23-2018.  Patient is chest pain-free here in the emergency department.  His only complaint is palpitations that he was experiencing at night.  D-dimer is negative.  Patient does have slight elevation in creatinine from a few days ago.  Cardiology was consulted for abnormal EKG. patient seen by Dr. Johnsie Cancel of cardiology who recommends stat echocardiogram and admission for further managment.They will admit the patient.  I appreciate his involvement.  Final Clinical Impressions(s) / ED Diagnoses   Final diagnoses:  Palpitations  Abnormal EKG    ED Discharge Orders    None       Tamala Julian 10/28/18 1710    Malvin Johns, MD 10/28/18 1717

## 2018-10-28 NOTE — ED Triage Notes (Signed)
Pt reports being seen for acid reflux. States that he has been taking the medications but now feels like his heart is racing. Pt denies any pain.

## 2018-10-28 NOTE — Progress Notes (Signed)
  Echocardiogram 2D Echocardiogram has been performed.  Daniel Golden 10/28/2018, 5:08 PM

## 2018-10-28 NOTE — ED Notes (Signed)
Attempted to call report, nurse unavailable.

## 2018-10-28 NOTE — H&P (Addendum)
Cardiology Consultation:   Patient ID: Daniel Golden MRN: [id number]; DOB: 1991/01/20  Admit date: 10/28/2018 Date of Consult: 10/28/2018  Primary Care Provider: Patient, No Pcp Per Primary Cardiologist: New (Dr. Johnsie Cancel)   Patient Profile:   Daniel Golden is a 28 y.o. male with no significant past medical history except recent diagnosis of GERD who is being seen today for the evaluation of palpitation and chest pain at the request of Dr. Tamera Punt.  No prior cardiac history.  Patient was born prematurely at 67-month  No developmental delay.  Paternal grandfather had CABG at 762  Paternal uncle died at age 26 unknown cause.  History of Present Illness:   Mr. CWulfwas diagnosed with GERD week ago after to ER visit.  He was having left-sided sharp chest pain.  He also reports having palpitation since then.  Noted mostly when calm and laying down.  He reports resolution of chest pain since taking Protonix and Pepcid.  However his palpitation continued.  He does not drink any caffeinated product since last week.  He stopped smoking tobacco and marijuana.  He denies dizziness, shortness of breath, orthopnea, PND, syncope or lower extremity edema.  No regular exercise.  Last night at 4:30 AM he woke up with worse episode of palpitation which lasted for few hours and gradually improved.  He came to ER for further evaluation.  Upon arrival his blood pressure was elevated at 145/104 at heart rate of 96 bpm.  Patient had a brief episode of sinus tachycardia at 130s at 12:30 PM.  He did felt palpitation at that time.  Serum creatinine 1.28.  Negative d-dimer.  He reports history of syncope one time after donating plasma.  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  Heart Pathway Score:     History reviewed. No pertinent past medical history.  Past Surgical History:  Procedure Laterality Date  . EYE SURGERY    . HERNIA REPAIR       Inpatient Medications: Scheduled Meds:  Continuous  Infusions:  PRN Meds:   Allergies:   No Known Allergies  Social History:   Social History   Socioeconomic History  . Marital status: Single    Spouse name: Not on file  . Number of children: Not on file  . Years of education: Not on file  . Highest education level: Not on file  Occupational History  . Not on file  Social Needs  . Financial resource strain: Not on file  . Food insecurity    Worry: Not on file    Inability: Not on file  . Transportation needs    Medical: Not on file    Non-medical: Not on file  Tobacco Use  . Smoking status: Never Smoker  . Smokeless tobacco: Never Used  Substance and Sexual Activity  . Alcohol use: No  . Drug use: No  . Sexual activity: Not on file  Lifestyle  . Physical activity    Days per week: Not on file    Minutes per session: Not on file  . Stress: Not on file  Relationships  . Social [place] phone: Not on file    Gets together: Not on file    Attends religious service: Not on file    Active member of club or organization: Not on file    Attends meetings of clubs or organizations: Not on file    Relationship status: Not on file  . Intimate partner violence    Fear  of current or ex partner: Not on file    Emotionally abused: Not on file    Physically abused: Not on file    Forced sexual activity: Not on file  Other Topics Concern  . Not on file  Social History Narrative  . Not on file    Family History:   Family History  Problem Relation Age of Onset  . Diabetes Mother   . Heart failure Mother   . Diabetes Father   . Heart failure Father      ROS:  Please see the history of present illness.  All other ROS reviewed and negative.     Physical Exam/Data:   Vitals:   10/28/18 1200 10/28/18 1327 10/28/18 1330 10/28/18 1400  BP: 127/77  134/83 130/79  Pulse: 75  69 62  Resp: _0 Temp:      TempSrc:      SpO2: 100%  100% 100%  Weight:  68 kg    Height:  _1  (1.753 m)     No  intake or output data in the 24 hours ending 10/28/18 1448 Last 3 Weights 10/28/2018 10/25/2018 10/23/2018  Weight (lbs) 149 lb 14.6 oz 150 lb 150 lb  Weight (kg) 68 kg 68.04 kg 68.04 kg     Body mass index is 22.14 kg/m.  General: Thin young male in no acute distress HEENT: normal Lymph: no adenopathy Neck: no JVD Endocrine:  No thryomegaly Vascular: No carotid bruits; FA pulses 2+ bilaterally without bruits  Cardiac:  normal S1, S2; RRR; no murmur  Lungs:  clear to auscultation bilaterally, no wheezing, rhonchi or rales  Abd: soft, nontender, no hepatomegaly  Ext: no edema Musculoskeletal:  No deformities, BUE and BLE strength normal and equal Skin: warm and dry  Neuro:  CNs 2-12 intact, no focal abnormalities noted Psych:  Normal affect   EKG:  The EKG was personally reviewed and demonstrates: Sinus rhythm at rate of 70 bpm, short PR interval,  T wave inversion in inferior lateral leads with ST depression which is more pronounced than last week telemetry:  Telemetry was personally reviewed and demonstrates: Sinus rhythm at rate mostly in 60 to 70s with episode of sinus tachycardia at 130 bpm  Relevant CV Studies: As above  Laboratory Data:  High Sensitivity Troponin:   Recent Labs  Lab 10/23/18 1625 10/23/18 1822  TROPONINIHS 6 6      Chemistry Recent Labs  Lab 10/23/18 1625 10/28/18 1200  NA 134* 139  K 4.1 4.6  CL 102 100  CO2 22 22  GLUCOSE 88 69*  BUN 11 12  CREATININE 1.19 1.28*  CALCIUM 9.3 9.9  GFRNONAA >60 >60  GFRAA >60 >60  ANIONGAP 10 17*    Hematology Recent Labs  Lab 10/23/18 1625 10/28/18 1200  WBC 5.6 5.3  RBC 5.65 6.14*  HGB 15.5 16.8  HCT 45.0 51.0  MCV 79.6* 83.1  MCH 27.4 27.4  MCHC 34.4 32.9  RDW 11.5 11.8  PLT 234 266    DDimer  Recent Labs  Lab 10/28/18 1200  DDIMER <0.27    Radiology/Studies:  No results found.  Assessment and Plan:   1. Chest pain - Resolved after started taking Pepcid and Nexium.  Likely related  to GERD. EKG with worsen T wave inversion with ST depression in interior lateral leads compared to last week. Check troponin. Stat echo. Start BB. COVID test. D-dimer negative. Admit and plan cardiac cath tomorrow.   2.  Palpitation - Ongoing for past 1 week.  Worse episode last night.    Brief episode of sinus tachycardia this afternoon. Monitor on tele. Add BB.   3. Transient elevation of BP - His BP was 145/93 on 8/3 - Today it was 145/104 on arrival, now improved to 130/79. - Add BB as above. Follow closely   4. Tobacco and marijuana use - Reports noted used since last week - Denies cocaine use - Check UDS  For questions or updates, please contact [place] Please consult [email or web address] for contact info under     Jarrett Soho, [place]  10/28/2018 2:48 PM   Patient examined chart reviewed Discussed care with patient, mother and Pa. Exam with thin black male Some developmental delay No murmur clear lungs soft abdomen good peripheral pulses with no edema. ECG abnormal with transient worsening of ST depression in inferior alteral leads. No ST elevation or PR depression . Voltage for LVH vs thin person. Palpitations sound benign History of drug use. Start lopressor. Unfortunately the cardiac CT scanner is down and don't know when it will be back up and there are multiple cases pending. Will get stat echo to r/o congenital heart disease, effusion, HOCM, or RWMAls. CXR NAD normal mediastinum.  Add ESR and troponin to labs. Admit to telemetry Will arrange cath for am to r/o CAD Risks including bleeding , stroke , contrast reaction, renal injury or need for emergency surgery discussed Mother and patient willing to proceed. Check UDS given admission of marijuana use make sure no cocaine that could be stimulating BP, HR and chest pain   Jenkins Rouge

## 2018-10-29 ENCOUNTER — Encounter (HOSPITAL_COMMUNITY)
Admission: EM | Disposition: A | Discharge status: Home / Self Care | Payer: Self-pay | Source: Home / Self Care | Attending: Emergency Medicine

## 2018-10-29 ENCOUNTER — Encounter (HOSPITAL_COMMUNITY): Payer: Self-pay | Admitting: Internal Medicine

## 2018-10-29 ENCOUNTER — Other Ambulatory Visit: Payer: Self-pay

## 2018-10-29 ENCOUNTER — Other Ambulatory Visit: Payer: Self-pay | Admitting: Physician Assistant

## 2018-10-29 ENCOUNTER — Telehealth: Payer: Self-pay | Admitting: Radiology

## 2018-10-29 DIAGNOSIS — I471 Supraventricular tachycardia: Secondary | ICD-10-CM

## 2018-10-29 DIAGNOSIS — R9431 Abnormal electrocardiogram [ECG] [EKG]: Secondary | ICD-10-CM | POA: Diagnosis not present

## 2018-10-29 DIAGNOSIS — R079 Chest pain, unspecified: Secondary | ICD-10-CM

## 2018-10-29 DIAGNOSIS — R0789 Other chest pain: Secondary | ICD-10-CM | POA: Diagnosis not present

## 2018-10-29 DIAGNOSIS — Z1159 Encounter for screening for other viral diseases: Secondary | ICD-10-CM | POA: Diagnosis not present

## 2018-10-29 DIAGNOSIS — F1210 Cannabis abuse, uncomplicated: Secondary | ICD-10-CM | POA: Diagnosis not present

## 2018-10-29 DIAGNOSIS — R002 Palpitations: Secondary | ICD-10-CM | POA: Diagnosis not present

## 2018-10-29 HISTORY — PX: LEFT HEART CATH AND CORONARY ANGIOGRAPHY: CATH118249

## 2018-10-29 LAB — BASIC METABOLIC PANEL
Anion gap: 13 (ref 5–15)
BUN: 10 mg/dL (ref 6–20)
CO2: 22 mmol/L (ref 22–32)
Calcium: 9.3 mg/dL (ref 8.9–10.3)
Chloride: 102 mmol/L (ref 98–111)
Creatinine, Ser: 1.15 mg/dL (ref 0.61–1.24)
GFR calc Af Amer: >60 mL/min (ref >60)
GFR calc non Af Amer: >60 mL/min (ref >60)
Glucose, Bld: 73 mg/dL (ref 70–99)
Potassium: 3.8 mmol/L (ref 3.5–5.1)
Sodium: 137 mmol/L (ref 135–145)

## 2018-10-29 LAB — LIPID PANEL
Cholesterol: 108 mg/dL (ref 0–200)
HDL: 31 mg/dL — ABNORMAL LOW (ref >40)
LDL Cholesterol: 66 mg/dL (ref 0–99)
Total CHOL/HDL Ratio: 3.5 RATIO
Triglycerides: 53 mg/dL (ref <150)
VLDL: 11 mg/dL (ref 0–40)

## 2018-10-29 LAB — HIV ANTIBODY (ROUTINE TESTING W REFLEX): HIV Screen 4th Generation wRfx: NONREACTIVE

## 2018-10-29 SURGERY — LEFT HEART CATH AND CORONARY ANGIOGRAPHY
Anesthesia: LOCAL

## 2018-10-29 MED ORDER — NITROGLYCERIN 1 MG/10 ML FOR IR/CATH LAB
INTRA_ARTERIAL | Status: DC | PRN
  Administered 2018-10-29: 100 ug via INTRACORONARY

## 2018-10-29 MED ORDER — VERAPAMIL HCL 2.5 MG/ML IV SOLN
INTRAVENOUS | Status: AC
  Filled 2018-10-29: qty 2

## 2018-10-29 MED ORDER — MIDAZOLAM HCL 2 MG/2ML IJ SOLN
INTRAMUSCULAR | Status: DC | PRN
  Administered 2018-10-29: 1 mg via INTRAVENOUS

## 2018-10-29 MED ORDER — METOPROLOL TARTRATE 25 MG PO TABS
25.0000 mg | ORAL_TABLET | Freq: Three times a day (TID) | ORAL | Status: DC
  Administered 2018-10-29 (×2): 25 mg via ORAL
  Filled 2018-10-29 (×2): qty 1

## 2018-10-29 MED ORDER — SODIUM CHLORIDE 0.9% FLUSH: 3.0000 mL | INTRAVENOUS | Status: DC | PRN

## 2018-10-29 MED ORDER — LIDOCAINE HCL (PF) 1 % IJ SOLN
INTRAMUSCULAR | Status: AC
  Filled 2018-10-29: qty 30

## 2018-10-29 MED ORDER — NITROGLYCERIN 1 MG/10 ML FOR IR/CATH LAB
INTRA_ARTERIAL | Status: AC
  Filled 2018-10-29: qty 10

## 2018-10-29 MED ORDER — SODIUM CHLORIDE 0.9 % IV SOLN: 250.0000 mL | INTRAVENOUS | Status: DC | PRN

## 2018-10-29 MED ORDER — METOPROLOL TARTRATE 25 MG PO TABS: 25.0000 mg | ORAL_TABLET | Freq: Two times a day (BID) | ORAL | 3 refills | Status: DC

## 2018-10-29 MED ORDER — SODIUM CHLORIDE 0.9 % IV SOLN: INTRAVENOUS | Status: AC

## 2018-10-29 MED ORDER — HEPARIN SODIUM (PORCINE) 1000 UNIT/ML IJ SOLN
INTRAMUSCULAR | Status: DC | PRN
  Administered 2018-10-29: 3000 [IU] via INTRAVENOUS

## 2018-10-29 MED ORDER — HEPARIN (PORCINE) IN NACL 1000-0.9 UT/500ML-% IV SOLN
INTRAVENOUS | Status: DC | PRN
  Administered 2018-10-29 (×2): 500 mL

## 2018-10-29 MED ORDER — FENTANYL CITRATE (PF) 100 MCG/2ML IJ SOLN
INTRAMUSCULAR | Status: AC
  Filled 2018-10-29: qty 2

## 2018-10-29 MED ORDER — VERAPAMIL HCL 2.5 MG/ML IV SOLN
INTRAVENOUS | Status: DC | PRN
  Administered 2018-10-29: 10 mL via INTRA_ARTERIAL

## 2018-10-29 MED ORDER — HYDRALAZINE HCL 20 MG/ML IJ SOLN: 10.0000 mg | INTRAMUSCULAR | Status: DC | PRN

## 2018-10-29 MED ORDER — LIDOCAINE HCL (PF) 1 % IJ SOLN
INTRAMUSCULAR | Status: DC | PRN
  Administered 2018-10-29: 2 mL via INTRADERMAL

## 2018-10-29 MED ORDER — IOHEXOL 350 MG/ML SOLN
INTRAVENOUS | Status: DC | PRN
  Administered 2018-10-29: 12:00:00 30 mL via INTRA_ARTERIAL

## 2018-10-29 MED ORDER — FENTANYL CITRATE (PF) 100 MCG/2ML IJ SOLN
INTRAMUSCULAR | Status: DC | PRN
  Administered 2018-10-29: 25 ug via INTRAVENOUS

## 2018-10-29 MED ORDER — SODIUM CHLORIDE 0.9% FLUSH
3.0000 mL | Freq: Two times a day (BID) | INTRAVENOUS | Status: DC
  Administered 2018-10-29: 3 mL via INTRAVENOUS

## 2018-10-29 MED ORDER — HEPARIN (PORCINE) IN NACL 1000-0.9 UT/500ML-% IV SOLN
INTRAVENOUS | Status: AC
  Filled 2018-10-29: qty 1000

## 2018-10-29 MED ORDER — MIDAZOLAM HCL 2 MG/2ML IJ SOLN
INTRAMUSCULAR | Status: AC
  Filled 2018-10-29: qty 2

## 2018-10-29 MED ORDER — LABETALOL HCL 5 MG/ML IV SOLN: 10.0000 mg | INTRAVENOUS | Status: DC | PRN

## 2018-10-29 MED ORDER — HEPARIN SODIUM (PORCINE) 5000 UNIT/ML IJ SOLN: 5000.0000 [IU] | Freq: Three times a day (TID) | INTRAMUSCULAR | Status: DC

## 2018-10-29 SURGICAL SUPPLY — 10 items
CATH INFINITI 5 FR MPA2 (CATHETERS) ×1 IMPLANT
CATH INFINITI JR4 5F (CATHETERS) ×1 IMPLANT
DEVICE RAD TR BAND REGULAR (VASCULAR PRODUCTS) ×1 IMPLANT
GUIDEWIRE INQWIRE 1.5J.035X260 (WIRE) IMPLANT
INQWIRE 1.5J .035X260CM (WIRE) ×2
KIT HEART LEFT (KITS) ×2 IMPLANT
PACK CARDIAC CATHETERIZATION (CUSTOM PROCEDURE TRAY) ×2 IMPLANT
SHEATH RAIN RADIAL 21G 6FR (SHEATH) ×1 IMPLANT
TRANSDUCER W/STOPCOCK (MISCELLANEOUS) ×2 IMPLANT
TUBING CIL FLEX 10 FLL-RA (TUBING) ×2 IMPLANT

## 2018-10-29 NOTE — Progress Notes (Addendum)
Progress Note  Patient Name: Daniel Golden Date of Encounter: 10/29/2018  Primary Cardiologist:Dr. Eden EmmsNishan   Subjective   No chest pain. Felt palpitations last nights.   Inpatient Medications    Scheduled Meds: . aspirin EC  81 mg Oral Daily  . famotidine  20 mg Oral BID  . heparin  5,000 Units Subcutaneous Q8H  . metoprolol tartrate  25 mg Oral BID  . pantoprazole  40 mg Oral Daily  . sodium chloride flush  3 mL Intravenous Q12H   Continuous Infusions: . sodium chloride    . sodium chloride 1 mL/kg/hr (10/29/18 0610)   PRN Meds: sodium chloride, acetaminophen, nitroGLYCERIN, ondansetron (ZOFRAN) IV, sodium chloride flush   Vital Signs    Vitals:   10/28/18 2157 10/29/18 0041 10/29/18 0432 10/29/18 0515  BP: 109/74 104/64 118/79   Pulse: 76 63 61   Resp:  20 20   Temp:  (!) 97.3 F (36.3 C) 98.8 F (37.1 C)   TempSrc:  Oral Oral   SpO2: 100% 100% 99%   Weight:    63.5 kg  Height:        Intake/Output Summary (Last 24 hours) at 10/29/2018 0814 Last data filed at 10/29/2018 0653 Gross per 24 hour  Intake 1703.13 ml  Output 400 ml  Net 1303.13 ml   Last 3 Weights 10/29/2018 10/28/2018 10/28/2018  Weight (lbs) 140 lb 140 lb 3.4 oz 149 lb 14.6 oz  Weight (kg) 63.504 kg 63.6 kg 68 kg      Telemetry    SR with ST depression in 60.s, intermittent tachycardia 100-200s- Personally Reviewed  ECG    SR with ST depression interiorly, resolved TWI in laterally - Personally Reviewed  Physical Exam   GEN: No acute distress.   Neck: No JVD Cardiac: RRR, no murmurs, rubs, or gallops.  Respiratory: Clear to auscultation bilaterally. GI: Soft, nontender, non-distended  MS: No edema; No deformity. Neuro:  Nonfocal  Psych: Normal affect   Labs    High Sensitivity Troponin:   Recent Labs  Lab 10/23/18 1625 10/23/18 1822 10/28/18 1524 10/28/18 1846 10/28/18 1940  TROPONINIHS 6 6 12  19* 18*       Chemistry Recent Labs  Lab 10/23/18 1625 10/28/18 1200  10/28/18 1846 10/29/18 0425  NA 134* 139  --  137  K 4.1 4.6  --  3.8  CL 102 100  --  102  CO2 22 22  --  22  GLUCOSE 88 69*  --  73  BUN 11 12  --  10  CREATININE 1.19 1.28* 1.25* 1.15  CALCIUM 9.3 9.9  --  9.3  GFRNONAA >60 >60 >60 >60  GFRAA >60 >60 >60 >60  ANIONGAP 10 17*  --  13     Hematology Recent Labs  Lab 10/23/18 1625 10/28/18 1200 10/28/18 1846  WBC 5.6 5.3 4.8  RBC 5.65 6.14* 5.64  HGB 15.5 16.8 15.6  HCT 45.0 51.0 46.0  MCV 79.6* 83.1 81.6  MCH 27.4 27.4 27.7  MCHC 34.4 32.9 33.9  RDW 11.5 11.8 11.8  PLT 234 266 253    BNPNo results for input(s): BNP, PROBNP in the last 168 hours.   DDimer  Recent Labs  Lab 10/28/18 1200  DDIMER <0.27     Radiology    No results found.  Cardiac Studies   Echo 10/28/18  1. The left ventricle has normal systolic function with an ejection fraction of 60-65%. The cavity size was normal. Left ventricular diastolic  parameters were normal.  2. The right ventricle has normal systolic function. The cavity was normal.  3. The tricuspid valve is grossly normal.  4. The aortic valve was not well visualized. No stenosis of the aortic valve.  5. The aorta is normal in size and structure.  6. Technically difficult; normal LV function.  Patient Profile     Daniel Golden is a 28 y.o. male with no significant past medical history except recent diagnosis of GERD seen for chest pain and palpitations.   Assessment & Plan    1. Chest pain  - Resolved after initiation of PPI and pepcid.   2. Abnormal EKG - TWI resolved in lateral leads on repeat EKG today. For cath later todat  3. Tachycardia - review of tele shows SVT when going in 190-200s and atrial tachycardia when above 100s - Increase metoprolol to 25mg  TID - Consult ER  4. Tobacco and marijuana use -UDS positive for marijuana - Recommended cessation   5. Transient BP elevation  - Now normalized   For questions or updates, please contact [name]  Please consult [email or web address] for contact info under        SignedLeanor Kail, PA  10/29/2018, 8:14 AM    Patient examined chart reviewed. Telemetry with multiple bouts of significant SVT/ATrial tachycardia associated with ST depression For cath today to r/o CAD or congenital abnormality in origins since he has ECG changes and chest pain with symptoms.  Arrhythmia precludes cardiac CT And scanner has been down. Continue beta blocker TTE poor quality study but no obvious structural heart dx.  Will ask EP to see would appear to be a good ablative candidate  Jenkins Rouge

## 2018-10-29 NOTE — Interval H&P Note (Signed)
History and Physical Interval Note:  10/29/2018 11:12 AM  Daniel Golden  has presented today for cardiac catheterization, with the diagnosis of chest pain.  The various methods of treatment have been discussed with the patient and family. After consideration of risks, benefits and other options for treatment, the patient has consented to  Procedure(s): LEFT HEART CATH AND CORONARY ANGIOGRAPHY (N/A) as a surgical intervention.  The patient's history has been reviewed, patient examined, no change in status, stable for surgery.  I have reviewed the patient's chart and labs.  Questions were answered to the patient's satisfaction.    Cath Lab Visit (complete for each Cath Lab visit)  Clinical Evaluation Leading to the Procedure:   ACS: Yes.   (unstable angina and SVT)  Non-ACS:  N/A  Taneka Espiritu

## 2018-10-29 NOTE — Discharge Summary (Signed)
Discharge Summary    Patient ID: Daniel Golden MRN: [id number]; DOB: 1991-01-22  Admit date: 10/28/2018 Discharge date: 10/29/2018  Primary Care Provider: Patient, No Pcp Per  Primary Cardiologist: Dr. Eden EmmsNishan  Primary Electrophysiologist:  Dr. Elberta Fortisamnitz  Discharge Diagnoses    Principal Problem:   SVT (supraventricular tachycardia) [facility](HCC) Active Problems:   Palpitations   Chest pain of uncertain etiology   Abnormal EKG   Marijuana abuse  Allergies No Known Allergies  Diagnostic Studies/Procedures    LEFT HEART CATH AND CORONARY ANGIOGRAPHY 10/29/2018  Conclusion  Conclusions: 1. Mild tapering of the apical LAD, which improves with intracoronary nitroglycerin suggestive of an element of vasospasm.  There is no angiographically significant atherosclerotic coronary artery disease. 2. Coronary arteries arise from expected locations. 3. Low normal left ventricular filling pressure.  Recommendations: 1. Proceed with electrophysiology evaluation and management of supraventricular tachycardia. 2. Primary prevention of coronary artery disease.     Echo 10/28/18  1. The left ventricle has normal systolic function with an ejection fraction of 60-65%. The cavity size was normal. Left ventricular diastolic parameters were normal.  2. The right ventricle has normal systolic function. The cavity was normal.  3. The tricuspid valve is grossly normal.  4. The aortic valve was not well visualized. No stenosis of the aortic valve.  5. The aorta is normal in size and structure.  6. Technically difficult; normal LV function.    History of Present Illness     Daniel Golden is a 28 y.o. male with no significant past medical history except recent diagnosis of GERD who was admitted for the evaluation of palpitation and chest pain.  No prior cardiac history.  Patient was born prematurely at 2943-month.  No developmental delay.  Paternal grandfather had CABG at 5476.  Paternal uncle died at age  105, unknown cause.  Mr. Marcha SoldersCathey was diagnosed with GERD week ago after  ER visit.  He was having left-sided sharp chest pain.  He also reported having palpitation since then.  Noted mostly when calm and laying down.  He reported resolution of chest pain since taking Protonix and Pepcid.  However his palpitation continued.  He does not drink any caffeinated product since last week.  He stopped smoking tobacco and marijuana.  He denied dizziness, shortness of breath, orthopnea, PND, syncope or lower extremity edema.  No regular exercise. He woke up (4:30 AM) with worse episode of palpitation which lasted for few hours and gradually improved.  He came to ER for further evaluation.  Upon arrival his blood pressure was elevated at 145/104 at heart rate of 96 bpm.  Patient had a brief episode of sinus tachycardia at 130s at 12:30 PM.  He did felt palpitation at that time.  Serum creatinine 1.28.  Negative d-dimer. EKG with new TWI with ST depression in interior lateral leads compared to last week.  He reports history of syncope one time after donating plasma.  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  Hospital Course     Consultants: EP  He was admitted for further evaluation. UDS positive for marijuana. Hs-troponin peaked at 19. Repeat EKG in morning showed resolved TWI in lateral leads. Echo showed preserved LV function. This was technically difficult study. Overnight telemetry showed SVT at 170-200s. He was evaluated by EP. Recommended continued BB. He underwent cardiac cath for abnormal EKG ( Unable to get coronary CT as scanner was down and tachycardia) which showed normal coronaries and vasospasm. See cath report. He will continue BB  and outpatient 14 days monitor. Follow up with Dr. Elberta Fortisamnitz later this month for ablation evaluation.   The patient been seen by Dr. Eden EmmsNishan today and deemed ready for discharge home. All follow-up appointments have been scheduled. Discharge medications are listed below.     Discharge Vitals Blood pressure 114/79, pulse 60, temperature 98.8 F (37.1 C), temperature source Oral, resp. rate 15, height 5\' 9"  (1.753 m), weight 63.5 kg, SpO2 100 %.  Filed Weights   10/28/18 1327 10/28/18 1800 10/29/18 0515  Weight: 68 kg 63.6 kg 63.5 kg    Labs & Radiologic Studies    CBC Recent Labs    10/28/18 1200 10/28/18 1846  WBC 5.3 4.8  NEUTROABS 3.2  --   HGB 16.8 15.6  HCT 51.0 46.0  MCV 83.1 81.6  PLT 266 253   Basic Metabolic Panel Recent Labs    16/12/9606/06/20 1200 10/28/18 1846 10/29/18 0425  NA 139  --  137  K 4.6  --  3.8  CL 100  --  102  CO2 22  --  22  GLUCOSE 69*  --  73  BUN 12  --  10  CREATININE 1.28* 1.25* 1.15  CALCIUM 9.9  --  9.3   D-Dimer Recent Labs    10/28/18 1200  DDIMER <0.27   Fasting Lipid Panel Recent Labs    10/29/18 0425  CHOL 108  HDL 31*  LDLCALC 66  TRIG 53  CHOLHDL 3.5   Thyroid Function Tests Recent Labs    10/28/18 1846  TSH 0.778   _____________  Dg Chest 2 View  Result Date: 10/23/2018 CLINICAL DATA:  Chest pain, shortness of breath and diaphoresis. EXAM: CHEST - 2 VIEW COMPARISON:  None. FINDINGS: Heart size is normal. Mediastinal shadows are normal. The lungs are clear. No bronchial thickening. No infiltrate, mass, effusion or collapse. Left hemidiaphragm slightly higher than the right, not likely significant. Pulmonary vascularity is normal. No bony abnormality. IMPRESSION: No active cardiopulmonary disease. Electronically Signed   By: Paulina FusiMark  Shogry M.D.   On: 10/23/2018 16:55   Disposition   Pt is being discharged home today in good condition.  Follow-up Plans & Appointments    Follow-up Information    Regan Lemmingamnitz, Will Martin, MD Follow up on 11/19/2018.   Specialty: Cardiology Why: at 330 for post hospital follow up. To discuss SVT ablation Contact information: [address] [place] [id number] [phone]          Discharge Instructions    Ambulatory referral to  Cardiology   Complete by: As directed    Diet - low sodium heart healthy   Complete by: As directed    Discharge instructions   Complete by: As directed    No driving for 48 hours. No lifting over 5 lbs for 1 week. No sexual activity for 1 week. You may return to work on 11/01/18. Keep procedure site clean & dry. If you notice increased pain, swelling, bleeding or pus, call/return!  You may shower, but no soaking baths/hot tubs/pools for 1 week.   Increase activity slowly   Complete by: As directed       Discharge Medications   Allergies as of 10/29/2018   No Known Allergies     Medication List    STOP taking these medications   ondansetron 4 MG tablet Commonly known as: Zofran     TAKE these medications   acetaminophen 325 MG tablet Commonly known as: TYLENOL Take 650 mg by mouth  every 6 (six) hours as needed. Pain   alum & mag hydroxide-simeth 161-096-04 MG/5ML suspension Commonly known as: Maalox Max Take 5 mLs by mouth every 6 (six) hours as needed for indigestion.   famotidine 20 MG tablet Commonly known as: PEPCID Take 1 tablet (20 mg total) by mouth 2 (two) times daily.   ibuprofen 800 MG tablet Commonly known as: ADVIL Take 1 tablet (800 mg total) by mouth 3 (three) times daily.   metoprolol tartrate 25 MG tablet Commonly known as: LOPRESSOR Take 1 tablet (25 mg total) by mouth 2 (two) times daily.   omeprazole 20 MG capsule Commonly known as: PRILOSEC Take 20 mg by mouth daily.   ZyrTEC-D Allergy & Congestion 5-120 MG tablet Generic drug: cetirizine-pseudoephedrine Take 1 tablet by mouth every 12 (twelve) hours.        Acute coronary syndrome (MI, NSTEMI, STEMI, etc) this admission?: No.    Outstanding Labs/Studies   Monitor as outpatient  Duration of Discharge Encounter   Greater than 30 minutes including physician time.  Signed, [name], PA 10/29/2018, 3:52 PM

## 2018-10-29 NOTE — Consult Note (Addendum)
ELECTROPHYSIOLOGY CONSULT NOTE    Patient ID: Daniel Golden MRN: [id number], DOB/AGE: [birth year] 28 y.o.  Admit date: 10/28/2018 Date of Consult: 10/29/2018  Primary Physician: Patient, No Pcp Per Primary Cardiologist: (New) Dr. Eden EmmsNishan Electrophysiologist: (New) Dr. Elberta Fortisamnitz  Referring Provider: Dr. Eden EmmsNishan  Patient Profile: Daniel Golden is a [age] y.o. male with a history of GERD who is being seen today for the evaluation of SVT/palpitations at the request of Dr. Eden EmmsNishan.  HPI:  Daniel Golden is a 28 y.o. male with recently diagnosed GERD who presented with left-sided sharp chest pain associated with palpitations. He could feel them most when he was quiet and lying down. Chest pain resolved with taking protonix and pepcid.  Palpitations have continued since ER visit last week. No caffeine x 1 week. He also stopped tobacco and THC.   He has not had dizziness, SOB, orthopnea, PND, syncope, or peripheral edema. No ETOH use. He woke up at 0430 m 10/28/2018 with significant palpitations that lasted hours.   Echo 10/28/2018 showed normal LVEF 60-65% and no obvious structural abnormalities.    He is feeling good this am. He has had palpitations every night for the past 7-10 days. It initially improved with treatment of his GERD, but has since worsened again. He denies family history of sudden cardiac death. 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  He denies ETOH use. He drinks a fair amount of caffeine, but has stopped completely since last week. Denies exertional chest pain, orthopnea, dyspnea on exertion, or peripheral edema.   Medical History GERD (Diagnosed 10/2018)  Surgical History:  Past Surgical History:  Procedure Laterality Date  . EYE SURGERY    . HERNIA REPAIR       Medications Prior to Admission  Medication Sig Dispense Refill Last Dose  . acetaminophen (TYLENOL) 325 MG tablet Take 650 mg by mouth every 6 (six) hours as needed. Pain   10/26/2018 at prn  .  alum & mag hydroxide-simeth (MAALOX MAX) 400-400-40 MG/5ML suspension Take 5 mLs by mouth every 6 (six) hours as needed for indigestion. 355 mL 0 10/26/2018 at prn  . famotidine (PEPCID) 20 MG tablet Take 1 tablet (20 mg total) by mouth 2 (two) times daily. 30 tablet 0 10/27/2018 at Unknown time  . omeprazole (PRILOSEC) 20 MG capsule Take 20 mg by mouth daily.   10/28/2018 at Unknown time  . ZYRTEC-D ALLERGY & CONGESTION 5-120 MG tablet Take 1 tablet by mouth every 12 (twelve) hours.   10/27/2018 at Unknown time  . ibuprofen (ADVIL,MOTRIN) 800 MG tablet Take 1 tablet (800 mg total) by mouth 3 (three) times daily. (Patient not taking: Reported on 10/28/2018) 21 tablet 0 Not Taking at Unknown time    Inpatient Medications:  . aspirin EC  81 mg Oral Daily  . famotidine  20 mg Oral BID  . heparin  5,000 Units Subcutaneous Q8H  . metoprolol tartrate  25 mg Oral TID  . pantoprazole  40 mg Oral Daily  . sodium chloride flush  3 mL Intravenous Q12H    Allergies: No Known Allergies  Social History   Socioeconomic History  . Marital status: Single    Spouse name: Not on file  . Number of children: Not on file  . Years of education: Not on file  . Highest education level: Not on file  Occupational History  . Not on file  Social Needs  . Financial resource strain: Not on file  . Food  insecurity    Worry: Not on file    Inability: Not on file  . Transportation needs    Medical: Not on file    Non-medical: Not on file  Tobacco Use  . Smoking status: Never Smoker  . Smokeless tobacco: Never Used  Substance and Sexual Activity  . Alcohol use: No  . Drug use: No  . Sexual activity: Not on file  Lifestyle  . Physical activity    Days per week: Not on file    Minutes per session: Not on file  . Stress: Not on file  Relationships  . Social Herbalist on phone: Not on file    Gets together: Not on file    Attends religious service: Not on file    Active member of club or organization:  Not on file    Attends meetings of clubs or organizations: Not on file    Relationship status: Not on file  . Intimate partner violence    Fear of current or ex partner: Not on file    Emotionally abused: Not on file    Physically abused: Not on file    Forced sexual activity: Not on file  Other Topics Concern  . Not on file  Social History Narrative  . Not on file     Family History  Problem Relation Age of Onset  . Diabetes Mother   . Heart failure Mother   . Diabetes Father   . Heart failure Father      Review of Systems: All other systems reviewed and are otherwise negative except as noted above.  Physical Exam: Vitals:   10/28/18 2157 10/29/18 0041 10/29/18 0432 10/29/18 0515  BP: 109/74 104/64 118/79   Pulse: 76 63 61   Resp:  20 20   Temp:  (!) 97.3 F (36.3 C) 98.8 F (37.1 C)   TempSrc:  Oral Oral   SpO2: 100% 100% 99%   Weight:    63.5 kg  Height:        GEN- The patient is well appearing, alert and oriented x 3 today.   HEENT: normocephalic, atraumatic; sclera clear, conjunctiva pink; hearing intact; oropharynx clear; neck supple Lungs- Clear to ausculation bilaterally, normal work of breathing.  No wheezes, rales, rhonchi Heart- Regular rate and rhythm, no murmurs, rubs or gallops GI- soft, non-tender, non-distended, bowel sounds present Extremities- no clubbing, cyanosis, or edema; DP/PT/radial pulses 2+ bilaterally MS- no significant deformity or atrophy Skin- warm and dry, no rash or lesion Psych- euthymic mood, full affect Neuro- strength and sensation are intact  Labs:   Lab Results  Component Value Date   WBC 4.8 10/28/2018   HGB 15.6 10/28/2018   HCT 46.0 10/28/2018   MCV 81.6 10/28/2018   PLT 253 10/28/2018    Recent Labs  Lab 10/29/18 0425  NA 137  K 3.8  CL 102  CO2 22  BUN 10  CREATININE 1.15  CALCIUM 9.3  GLUCOSE 73      Radiology/Studies: Dg Chest 2 View  Result Date: 10/23/2018 CLINICAL DATA:  Chest pain, shortness  of breath and diaphoresis. EXAM: CHEST - 2 VIEW COMPARISON:  None. FINDINGS: Heart size is normal. Mediastinal shadows are normal. The lungs are clear. No bronchial thickening. No infiltrate, mass, effusion or collapse. Left hemidiaphragm slightly higher than the right, not likely significant. Pulmonary vascularity is normal. No bony abnormality. IMPRESSION: No active cardiopulmonary disease. Electronically Signed   By: Jan Fireman.D.  On: 10/23/2018 16:55   EKG: this am shows NSR with sinus arrhythmia at 70 bpm   (personally reviewed)  Previous EKGs reviewed. None show his tachycardia. PR interval ~110-120 ms   TELEMETRY: NSR 60s with SVT noted overnight into 170s that gradually slowed. (personally reviewed)  Assessment/Plan: 1.  Tachycardia/Palpitations - With SVT into 170s seen on tele.  Started on Lopressor. Educated on 1 caffeinated beverage a day, but to avoid if possible. No ETOH use.  With BB on board, would likely not be inducible in an EPS. Rien Marland likely plan outpatient follow up for consideration of ablation if continues despite BB.   2. Abnormal EKG - TWI in lateral leads previously noted has resolved. For R/LHC this am.   3. Tobacco and THC use - Cessation recommended.  Outpatient follow up scheduled for 11/19/2018 at 330 pm to discuss SVT ablation.   For questions or updates, please contact [facility] Please consult [email or web address] for contact info under Cardiology/STEMI.  Wilhelmina McardleSigned, Michael Andrew Tillery, PA-C  Pager: [phone]  10/29/2018 9:10 AM  I have seen and examined this patient with Otilio SaberAndy Tillery.  Agree with above, note added to reflect my findings.  On exam, RRR, no murmurs, lungs clear.  Mouth palpitations.  He also had T wave inversions and thus had left heart catheterization that showed no evidence of coronary artery disease.  He has been started on metoprolol.  Would have him discharged today and follow-up in EP clinic for further discussions of ablation.   Rogina Schiano M. Donta Mcinroy MD 10/29/2018 2:43 PM

## 2018-10-29 NOTE — H&P (View-Only) (Signed)
 Progress Note  Patient Name: Daniel Golden Date of Encounter: 10/29/2018  Primary Cardiologist:Dr. Rykar Lebleu   Subjective   No chest pain. Felt palpitations last nights.   Inpatient Medications    Scheduled Meds: . aspirin EC  81 mg Oral Daily  . famotidine  20 mg Oral BID  . heparin  5,000 Units Subcutaneous Q8H  . metoprolol tartrate  25 mg Oral BID  . pantoprazole  40 mg Oral Daily  . sodium chloride flush  3 mL Intravenous Q12H   Continuous Infusions: . sodium chloride    . sodium chloride 1 mL/kg/hr (10/29/18 0610)   PRN Meds: sodium chloride, acetaminophen, nitroGLYCERIN, ondansetron (ZOFRAN) IV, sodium chloride flush   Vital Signs    Vitals:   10/28/18 2157 10/29/18 0041 10/29/18 0432 10/29/18 0515  BP: 109/74 104/64 118/79   Pulse: 76 63 61   Resp:  20 20   Temp:  (!) 97.3 F (36.3 C) 98.8 F (37.1 C)   TempSrc:  Oral Oral   SpO2: 100% 100% 99%   Weight:    63.5 kg  Height:        Intake/Output Summary (Last 24 hours) at 10/29/2018 0814 Last data filed at 10/29/2018 0653 Gross per 24 hour  Intake 1703.13 ml  Output 400 ml  Net 1303.13 ml   Last 3 Weights 10/29/2018 10/28/2018 10/28/2018  Weight (lbs) 140 lb 140 lb 3.4 oz 149 lb 14.6 oz  Weight (kg) 63.504 kg 63.6 kg 68 kg      Telemetry    SR with ST depression in 60.s, intermittent tachycardia 100-200s- Personally Reviewed  ECG    SR with ST depression interiorly, resolved TWI in laterally - Personally Reviewed  Physical Exam   GEN: No acute distress.   Neck: No JVD Cardiac: RRR, no murmurs, rubs, or gallops.  Respiratory: Clear to auscultation bilaterally. GI: Soft, nontender, non-distended  MS: No edema; No deformity. Neuro:  Nonfocal  Psych: Normal affect   Labs    High Sensitivity Troponin:   Recent Labs  Lab 10/23/18 1625 10/23/18 1822 10/28/18 1524 10/28/18 1846 10/28/18 1940  TROPONINIHS 6 6 12 19* 18*       Chemistry Recent Labs  Lab 10/23/18 1625 10/28/18 1200  10/28/18 1846 10/29/18 0425  NA 134* 139  --  137  K 4.1 4.6  --  3.8  CL 102 100  --  102  CO2 22 22  --  22  GLUCOSE 88 69*  --  73  BUN 11 12  --  10  CREATININE 1.19 1.28* 1.25* 1.15  CALCIUM 9.3 9.9  --  9.3  GFRNONAA >60 >60 >60 >60  GFRAA >60 >60 >60 >60  ANIONGAP 10 17*  --  13     Hematology Recent Labs  Lab 10/23/18 1625 10/28/18 1200 10/28/18 1846  WBC 5.6 5.3 4.8  RBC 5.65 6.14* 5.64  HGB 15.5 16.8 15.6  HCT 45.0 51.0 46.0  MCV 79.6* 83.1 81.6  MCH 27.4 27.4 27.7  MCHC 34.4 32.9 33.9  RDW 11.5 11.8 11.8  PLT 234 266 253    BNPNo results for input(s): BNP, PROBNP in the last 168 hours.   DDimer  Recent Labs  Lab 10/28/18 1200  DDIMER <0.27     Radiology    No results found.  Cardiac Studies   Echo 10/28/18  1. The left ventricle has normal systolic function with an ejection fraction of 60-65%. The cavity size was normal. Left ventricular diastolic   parameters were normal.  2. The right ventricle has normal systolic function. The cavity was normal.  3. The tricuspid valve is grossly normal.  4. The aortic valve was not well visualized. No stenosis of the aortic valve.  5. The aorta is normal in size and structure.  6. Technically difficult; normal LV function.  Patient Profile     Daniel Golden is a 28 y.o. male with no significant past medical history except recent diagnosis of GERD seen for chest pain and palpitations.   Assessment & Plan    1. Chest pain  - Resolved after initiation of PPI and pepcid.   2. Abnormal EKG - TWI resolved in lateral leads on repeat EKG today. For cath later todat  3. Tachycardia - review of tele shows SVT when going in 190-200s and atrial tachycardia when above 100s - Increase metoprolol to 25mg  TID - Consult ER  4. Tobacco and marijuana use -UDS positive for marijuana - Recommended cessation   5. Transient BP elevation  - Now normalized   For questions or updates, please contact Daniel Golden  Please consult [email or web address] for contact info under        SignedLeanor Kail, PA  10/29/2018, 8:14 AM    Patient examined chart reviewed. Telemetry with multiple bouts of significant SVT/ATrial tachycardia associated with ST depression For cath today to r/o CAD or congenital abnormality in origins since he has ECG changes and chest pain with symptoms.  Arrhythmia precludes cardiac CT And scanner has been down. Continue beta blocker TTE poor quality study but no obvious structural heart dx.  Will ask EP to see would appear to be a good ablative candidate  Daniel Golden

## 2018-10-29 NOTE — Telephone Encounter (Signed)
Enrolled patient for a 2 week [employer] Event monitor to be mailed. Brief instructions were gone over with the patient and he knows to expect the monitor to arrive in 3-4 days. *changed order from [employer] to [employer] due to insurance

## 2018-11-03 ENCOUNTER — Encounter (INDEPENDENT_AMBULATORY_CARE_PROVIDER_SITE_OTHER): Payer: BC Managed Care – PPO

## 2018-11-03 DIAGNOSIS — I471 Supraventricular tachycardia: Secondary | ICD-10-CM

## 2018-11-19 ENCOUNTER — Encounter: Payer: Self-pay | Admitting: Cardiology

## 2018-11-19 ENCOUNTER — Other Ambulatory Visit: Payer: Self-pay

## 2018-11-19 ENCOUNTER — Ambulatory Visit (INDEPENDENT_AMBULATORY_CARE_PROVIDER_SITE_OTHER): Payer: BC Managed Care – PPO | Admitting: Cardiology

## 2018-11-19 VITALS — BP 102/64 | HR 63 | Ht 69.0 in | Wt 144.0 lb

## 2018-11-19 DIAGNOSIS — Z01812 Encounter for preprocedural laboratory examination: Secondary | ICD-10-CM | POA: Diagnosis not present

## 2018-11-19 DIAGNOSIS — I471 Supraventricular tachycardia: Secondary | ICD-10-CM | POA: Diagnosis not present

## 2018-11-19 MED ORDER — METOPROLOL TARTRATE 25 MG PO TABS: 25.0000 mg | ORAL_TABLET | Freq: Two times a day (BID) | ORAL | 3 refills | Status: DC

## 2018-11-19 NOTE — Addendum Note (Signed)
Addended by: Stanton Kidney on: 11/19/2018 04:14 PM   Modules accepted: Orders

## 2018-11-19 NOTE — Patient Instructions (Signed)
Medication Instructions:  Your physician recommends that you continue on your current medications as directed. Please refer to the Current Medication list given to you today.  Labwork: Return for pre procedure lab work on 9/29   for BMET & CBC *Will notify you of abnormal results, otherwise continue current treatment plan.  Testing/Procedures: Your physician has recommended that you have an ablation. Catheter ablation is a medical procedure used to treat some cardiac arrhythmias (irregular heartbeats). During catheter ablation, a long, thin, flexible tube is put into a blood vessel in your groin (upper thigh), or neck. This tube is called an ablation catheter. It is then guided to your heart through the blood vessel. Radio frequency waves destroy small areas of heart tissue where abnormal heartbeats may cause an arrhythmia to start. Please see instructions below  Follow-Up: Your physician recommends that you schedule a follow-up appointment in: 4 weeks, after your procedure on 12/24/18, with Dr. Curt Bears.  * If you need a refill on your cardiac medications before your next appointment, please call your pharmacy.   *Please note that any paperwork needing to be filled out by the provider will need to be addressed at the front desk prior to seeing the provider. Please note that any FMLA, disability or other documents regarding health condition is subject to a $25.00 charge that must be received prior to completion of paperwork in the form of a money order or check.  Thank you for choosing [facility]!!   Trinidad Curet, RN [phone]  Any Other Special Instructions Will Be Listed Below (If Applicable).    Electrophysiology/Ablation Procedure Instructions   You are scheduled for a(n) SVT ablation on 12/24/18 with Dr. Allegra Lai.   1.   Pre procedure testing-             A.  LAB WORK --- On 12/21/18 you will first go to the [facility] office (see address at the top of this letter) for  your pre procedure blood work.                 B. COVID TEST-- On  12/21/18 @ 3:00 pm - after you get your lab work- You will go to [facility] ([place]) for your Covid testing.   This is a drive thru test site.  There will be multiple testing areas.  Be sure to share with the first checkpoint that you are there for pre-procedure/surgery testing. This will put you into the right (yellow) lane that leads to the PAT testing team. Stay in your car and the nurse team will come to your car to test you.  After you are tested please go home and self quarantine until the day of your procedure.     2. On the day of your procedure 12/24/18 you will go to [facility] [phone]) at 8:30 a.m..  You will go to the main entrance [address] [employer]) and enter where the [employer] are.  Your driver will drop you off and you will head down the hallway to ADMITTING.  You may have one support person come in to the hospital with you.  They will be asked to wait in the waiting room.   3.   Do not eat or drink after midnight prior to your procedure.   4.   Do NOT take any medications the morning of your procedure.   5.  Plan for an overnight stay.  If you use  your phone frequently bring your phone charger.   * If you have ANY questions please call the office [phone] and ask for Nicolis Boody RN or send me a MyChart message   * Occasionally, EP Studies and ablations can become lengthy.  Please make your family aware of this before your procedure starts.  Average time ranges from 2-8 hours for EP studies/ablations.  Your physician will call your family after the procedure with the results.                                    Cardiac Ablation Cardiac ablation is a procedure to disable (ablate) a small amount of heart tissue in very specific places. The heart has many electrical connections. Sometimes these connections are abnormal and can cause the heart to beat very fast  or irregularly. Ablating some of the problem areas can improve the heart rhythm or return it to normal. Ablation may be done for people who:  Have Wolff-Parkinson-White syndrome.  Have fast heart rhythms (tachycardia).  Have taken medicines for an abnormal heart rhythm (arrhythmia) that were not effective or caused side effects.  Have a high-risk heartbeat that may be life-threatening.  During the procedure, a small incision is made in the neck or the groin, and a long, thin, flexible tube (catheter) is inserted into the incision and moved to the heart. Small devices (electrodes) on the tip of the catheter will send out electrical currents. A type of X-ray (fluoroscopy) will be used to help guide the catheter and to provide images of the heart. Tell a health care provider about:  Any allergies you have.  All medicines you are taking, including vitamins, herbs, eye drops, creams, and over-the-counter medicines.  Any problems you or family members have had with anesthetic medicines.  Any blood disorders you have.  Any surgeries you have had.  Any medical conditions you have, such as kidney failure.  Whether you are pregnant or may be pregnant. What are the risks? Generally, this is a safe procedure. However, problems may occur, including:  Infection.  Bruising and bleeding at the catheter insertion site.  Bleeding into the chest, especially into the sac that surrounds the heart. This is a serious complication.  Stroke or blood clots.  Damage to other structures or organs.  Allergic reaction to medicines or dyes.  Need for a permanent pacemaker if the normal electrical system is damaged. A pacemaker is a small computer that sends electrical signals to the heart and helps your heart beat normally.  The procedure not being fully effective. This may not be recognized until months later. Repeat ablation procedures are sometimes required.  What happens before the procedure?   Follow instructions from your health care provider about eating or drinking restrictions.  Ask your health care provider about: ? Changing or stopping your regular medicines. This is especially important if you are taking diabetes medicines or blood thinners. ? Taking medicines such as aspirin and ibuprofen. These medicines can thin your blood. Do not take these medicines before your procedure if your health care provider instructs you not to.  Plan to have someone take you home from the hospital or clinic.  If you will be going home right after the procedure, plan to have someone with you for 24 hours. What happens during the procedure?  To lower your risk of infection: ? Your health care team will wash or sanitize their  hands. ? Your skin will be washed with soap. ? Hair may be removed from the incision area.  An IV tube will be inserted into one of your veins.  You will be given a medicine to help you relax (sedative).  The skin on your neck or groin will be numbed.  An incision will be made in your neck or your groin.  A needle will be inserted through the incision and into a large vein in your neck or groin.  A catheter will be inserted into the needle and moved to your heart.  Dye may be injected through the catheter to help your surgeon see the area of the heart that needs treatment.  Electrical currents will be sent from the catheter to ablate heart tissue in desired areas. There are three types of energy that may be used to ablate heart tissue: ? Heat (radiofrequency energy). ? Laser energy. ? Extreme cold (cryoablation).  When the necessary tissue has been ablated, the catheter will be removed.  Pressure will be held on the catheter insertion area to prevent excessive bleeding.  A bandage (dressing) will be placed over the catheter insertion area. The procedure may vary among health care providers and hospitals. What happens after the procedure?  Your blood  pressure, heart rate, breathing rate, and blood oxygen level will be monitored until the medicines you were given have worn off.  Your catheter insertion area will be monitored for bleeding. You will need to lie still for a few hours to ensure that you do not bleed from the catheter insertion area.  Do not drive for 5-7 days or as long as directed by your health care provider. Summary  Cardiac ablation is a procedure to disable (ablate) a small amount of heart tissue in very specific places. Ablating some of the problem areas can improve the heart rhythm or return it to normal.  During the procedure, electrical currents will be sent from the catheter to ablate heart tissue in desired areas. This information is not intended to replace advice given to you by your health care provider. Make sure you discuss any questions you have with your health care provider. Document Released: 07/27/2008 Document Revised: 01/28/2016 Document Reviewed: 01/28/2016 Elsevier Interactive Patient Education  [employer].

## 2018-11-19 NOTE — Addendum Note (Signed)
Addended by: Stanton Kidney on: 11/19/2018 04:17 PM   Modules accepted: Orders

## 2018-11-19 NOTE — Progress Notes (Signed)
Electrophysiology Office Note   Date:  11/19/2018   ID:  Daniel Golden, Daniel Golden 04-Aug-1990, MRN [id number]  PCP:  Patient, No Pcp Per  Cardiologist:  Daniel Golden Primary Electrophysiologist:  Daniel Meredith Leeds, MD    No chief complaint on file.    History of Present Illness: Daniel Golden is a 28 y.o. male who is being seen today for the evaluation of SVT at the request of Daniel Masker B, PA-C. Presenting today for electrophysiology evaluation.  He presented to the hospital 10/28/2018 with palpitations and chest pain.  He had a abnormal ECG with mild ST elevations and T wave inversions and had a left heart catheterization that showed no evidence of coronary disease with possible coronary spasm.  He continued to have episodic palpitations with SVT noted on telemetry.  He was placed on a beta-blocker and discharged from the hospital.  Since hospital discharge, he has had no further episodes of SVT.  Today, he denies symptoms of palpitations, chest pain, shortness of breath, orthopnea, PND, lower extremity edema, claudication, dizziness, presyncope, syncope, bleeding, or neurologic sequela. The patient is tolerating medications without difficulties.    History reviewed. No pertinent past medical history. Past Surgical History:  Procedure Laterality Date  . EYE SURGERY    . HERNIA REPAIR    . LEFT HEART CATH AND CORONARY ANGIOGRAPHY N/A 10/29/2018   Procedure: LEFT HEART CATH AND CORONARY ANGIOGRAPHY;  Surgeon: Nelva Bush, MD;  Location: [facility];  Service: Cardiovascular;  Laterality: N/A;     Current Outpatient Medications  Medication Sig Dispense Refill  . acetaminophen (TYLENOL) 325 MG tablet Take 650 mg by mouth every 6 (six) hours as needed. Pain    . alum & mag hydroxide-simeth (MAALOX MAX) 400-400-40 MG/5ML suspension Take 5 mLs by mouth every 6 (six) hours as needed for indigestion. 355 mL 0  . famotidine (PEPCID) 20 MG tablet Take 1 tablet (20 mg total) by  mouth 2 (two) times daily. 30 tablet 0  . ibuprofen (ADVIL,MOTRIN) 800 MG tablet Take 1 tablet (800 mg total) by mouth 3 (three) times daily. 21 tablet 0  . levocetirizine (XYZAL) 5 MG tablet Take 5 mg by mouth daily.    . metoprolol tartrate (LOPRESSOR) 25 MG tablet Take 1 tablet (25 mg total) by mouth 2 (two) times daily. 60 tablet 3  . omeprazole (PRILOSEC) 20 MG capsule Take 20 mg by mouth daily.     No current facility-administered medications for this visit.     Allergies:   Patient has no known allergies.   Social History:  The patient  reports that he has never smoked. He has never used smokeless tobacco. He reports that he does not drink alcohol or use drugs.   Family History:  The patient's family history includes Diabetes in his father and mother; Heart failure in his father and mother.    ROS:  Please see the history of present illness.   Otherwise, review of systems is positive for none.   All other systems are reviewed and negative.    PHYSICAL EXAM: VS:  BP 102/64   Pulse 63   Ht 5\' 9"  (1.753 m)   Wt 144 lb (65.3 kg)   SpO2 99%   BMI 21.27 kg/m  , BMI Body mass index is 21.27 kg/m. GEN: Well nourished, well developed, in no acute distress  HEENT: normal  Neck: no JVD, carotid bruits, or masses Cardiac: RRR; no murmurs, rubs, or gallops,no edema  Respiratory:  clear to auscultation bilaterally, normal work of breathing GI: soft, nontender, nondistended, + BS MS: no deformity or atrophy  Skin: warm and dry Neuro:  Strength and sensation are intact Psych: euthymic mood, full affect  EKG:  EKG is ordered today. Personal review of the ekg ordered shows sinus rhythm, nonspecific T wave abnormality, rate 63  Recent Labs: 10/28/2018: Hemoglobin 15.6; Platelets 253; TSH 0.778 10/29/2018: BUN 10; Creatinine, Ser 1.15; Potassium 3.8; Sodium 137    Lipid Panel     Component Value Date/Time   CHOL 108 10/29/2018 0425   TRIG 53 10/29/2018 0425   HDL 31 (L) 10/29/2018  0425   CHOLHDL 3.5 10/29/2018 0425   VLDL 11 10/29/2018 0425   LDLCALC 66 10/29/2018 0425     Wt Readings from Last 3 Encounters:  11/19/18 144 lb (65.3 kg)  10/29/18 140 lb (63.5 kg)  10/25/18 150 lb (68 kg)      Other studies Reviewed: Additional studies/ records that were reviewed today include: [facility] 11/03/18  Review of the above records today demonstrates:  Heart rate 72 beats per minutes, sinus rhythm.   No critical or serious events occurred.   Symptoms of fluttering or skipped beats associated with sinus rhythm. 0 atrial fibrillation noted.  TTE 10/28/18 1. The left ventricle has normal systolic function with an ejection fraction of 60-65%. The cavity size was normal. Left ventricular diastolic parameters were normal.  2. The right ventricle has normal systolic function. The cavity was normal.  3. The tricuspid valve is grossly normal.  4. The aortic valve was not well visualized. No stenosis of the aortic valve.  5. The aorta is normal in size and structure.  6. Technically difficult; normal LV function.  ASSESSMENT AND PLAN:  1.  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  Despite that, he would prefer to have ablation.  Risks and benefits were discussed which include bleeding, tamponade, heart block, stroke.  The patient understands these risks and is agreed to the procedure.    Current medicines are reviewed at length with the patient today.   The patient does not have concerns regarding his medicines.  The following changes were made today:  none  Labs/ tests ordered today include:  Orders Placed This Encounter  Procedures  . EKG 12-Lead     Disposition:   FU with Daniel Golden 3 months  Signed, Daniel Jorja LoaMartin Camnitz, MD  11/19/2018 3:47 PM     [facility] [address] [place] [address] [phone] (office)  [phone] (fax)

## 2018-12-06 ENCOUNTER — Telehealth: Payer: Self-pay | Admitting: Cardiology

## 2018-12-06 NOTE — Telephone Encounter (Signed)
Pt reports that his allergies are acting up and he is experiencing a lot of saliva.  Concerned this may be medication related.  Advised this is not secondary to his Lopressor. Pt advised to take OTC allergy medication to see if improvement in symptoms.   Patient verbalized understanding and agreeable to plan.

## 2018-12-06 NOTE — Telephone Encounter (Signed)
Patient called in regards to his procedure, and his medication. He has been spitting up a lot. He just wants to double check the medication he needs to be taking.

## 2018-12-21 ENCOUNTER — Other Ambulatory Visit: Payer: BC Managed Care – PPO

## 2018-12-21 ENCOUNTER — Other Ambulatory Visit (HOSPITAL_COMMUNITY)
Admission: RE | Admit: 2018-12-21 | Discharge: 2018-12-21 | Disposition: A | Discharge status: Home / Self Care | Payer: BC Managed Care – PPO | Source: Ambulatory Visit | Attending: Cardiology | Admitting: Cardiology

## 2018-12-21 ENCOUNTER — Other Ambulatory Visit: Payer: Self-pay

## 2018-12-21 DIAGNOSIS — Z01812 Encounter for preprocedural laboratory examination: Secondary | ICD-10-CM | POA: Diagnosis not present

## 2018-12-21 DIAGNOSIS — Z20828 Contact with and (suspected) exposure to other viral communicable diseases: Secondary | ICD-10-CM | POA: Diagnosis not present

## 2018-12-21 DIAGNOSIS — I471 Supraventricular tachycardia: Secondary | ICD-10-CM | POA: Insufficient documentation

## 2018-12-21 LAB — CBC
Hematocrit: 46.4 % (ref 37.5–51.0)
Hemoglobin: 15.4 g/dL (ref 13.0–17.7)
MCH: 26.7 pg (ref 26.6–33.0)
MCHC: 33.2 g/dL (ref 31.5–35.7)
MCV: 80 fL (ref 79–97)
Platelets: 190 10*3/uL (ref 150–450)
RBC: 5.77 x10E6/uL (ref 4.14–5.80)
RDW: 11.6 % (ref 11.6–15.4)
WBC: 2.9 10*3/uL — ABNORMAL LOW (ref 3.4–10.8)

## 2018-12-21 LAB — BASIC METABOLIC PANEL
BUN/Creatinine Ratio: 11 (ref 9–20)
BUN: 12 mg/dL (ref 6–20)
CO2: 24 mmol/L (ref 20–29)
Calcium: 9.6 mg/dL (ref 8.7–10.2)
Chloride: 102 mmol/L (ref 96–106)
Creatinine, Ser: 1.09 mg/dL (ref 0.76–1.27)
GFR calc Af Amer: 108 mL/min/{1.73_m2} (ref >59)
GFR calc non Af Amer: 93 mL/min/{1.73_m2} (ref >59)
Glucose: 73 mg/dL (ref 65–99)
Potassium: 4.4 mmol/L (ref 3.5–5.2)
Sodium: 139 mmol/L (ref 134–144)

## 2018-12-22 LAB — NOVEL CORONAVIRUS, NAA (HOSP ORDER, SEND-OUT TO REF LAB; TAT 18-24 HRS): SARS-CoV-2, NAA: NOT DETECTED

## 2018-12-24 ENCOUNTER — Encounter (HOSPITAL_COMMUNITY)
Admission: RE | Disposition: A | Discharge status: Home / Self Care | Payer: Self-pay | Source: Home / Self Care | Attending: Cardiology

## 2018-12-24 ENCOUNTER — Other Ambulatory Visit: Payer: Self-pay

## 2018-12-24 ENCOUNTER — Encounter (HOSPITAL_COMMUNITY): Payer: Self-pay | Admitting: Emergency Medicine

## 2018-12-24 ENCOUNTER — Ambulatory Visit (HOSPITAL_COMMUNITY)
Admission: RE | Admit: 2018-12-24 | Discharge: 2018-12-24 | Disposition: A | Discharge status: Home / Self Care | Payer: BC Managed Care – PPO | Attending: Cardiology | Admitting: Cardiology

## 2018-12-24 DIAGNOSIS — I471 Supraventricular tachycardia: Secondary | ICD-10-CM | POA: Diagnosis not present

## 2018-12-24 DIAGNOSIS — Z8249 Family history of ischemic heart disease and other diseases of the circulatory system: Secondary | ICD-10-CM | POA: Diagnosis not present

## 2018-12-24 HISTORY — PX: SVT ABLATION: EP1225

## 2018-12-24 HISTORY — DX: Headache, unspecified: R51.9

## 2018-12-24 SURGERY — SVT ABLATION
Anesthesia: LOCAL

## 2018-12-24 MED ORDER — FENTANYL CITRATE (PF) 100 MCG/2ML IJ SOLN
INTRAMUSCULAR | Status: AC
  Filled 2018-12-24: qty 2

## 2018-12-24 MED ORDER — HEPARIN (PORCINE) IN NACL 1000-0.9 UT/500ML-% IV SOLN
INTRAVENOUS | Status: AC
  Filled 2018-12-24: qty 500

## 2018-12-24 MED ORDER — BUPIVACAINE HCL (PF) 0.25 % IJ SOLN
INTRAMUSCULAR | Status: DC | PRN
  Administered 2018-12-24: 30 mL

## 2018-12-24 MED ORDER — DILTIAZEM HCL ER COATED BEADS 180 MG PO CP24: 180.0000 mg | ORAL_CAPSULE | Freq: Every day | ORAL | 11 refills | Status: DC

## 2018-12-24 MED ORDER — SODIUM CHLORIDE 0.9 % IV SOLN
INTRAVENOUS | Status: DC
  Administered 2018-12-24: 09:00:00 via INTRAVENOUS

## 2018-12-24 MED ORDER — HEPARIN (PORCINE) IN NACL 2000-0.9 UNIT/L-% IV SOLN
INTRAVENOUS | Status: DC | PRN
  Administered 2018-12-24: 500 mL

## 2018-12-24 MED ORDER — MIDAZOLAM HCL 5 MG/5ML IJ SOLN
INTRAMUSCULAR | Status: DC | PRN
  Administered 2018-12-24: 1 mg via INTRAVENOUS
  Administered 2018-12-24: 2 mg via INTRAVENOUS

## 2018-12-24 MED ORDER — SODIUM CHLORIDE 0.9% FLUSH: 3.0000 mL | INTRAVENOUS | Status: DC | PRN

## 2018-12-24 MED ORDER — ISOPROTERENOL HCL 0.2 MG/ML IJ SOLN
INTRAVENOUS | Status: DC | PRN
  Administered 2018-12-24: 2 ug/min via INTRAVENOUS

## 2018-12-24 MED ORDER — HEPARIN (PORCINE) IN NACL 1000-0.9 UT/500ML-% IV SOLN
INTRAVENOUS | Status: DC | PRN
  Administered 2018-12-24: 500 mL

## 2018-12-24 MED ORDER — ONDANSETRON HCL 4 MG/2ML IJ SOLN: 4.0000 mg | Freq: Four times a day (QID) | INTRAMUSCULAR | Status: DC | PRN

## 2018-12-24 MED ORDER — BUPIVACAINE HCL (PF) 0.25 % IJ SOLN
INTRAMUSCULAR | Status: AC
  Filled 2018-12-24: qty 60

## 2018-12-24 MED ORDER — ACETAMINOPHEN 325 MG PO TABS: 650.0000 mg | ORAL_TABLET | ORAL | Status: DC | PRN

## 2018-12-24 MED ORDER — ISOPROTERENOL HCL 0.2 MG/ML IJ SOLN
INTRAMUSCULAR | Status: AC
  Filled 2018-12-24: qty 5

## 2018-12-24 MED ORDER — SODIUM CHLORIDE 0.9 % IV SOLN: 250.0000 mL | INTRAVENOUS | Status: DC | PRN

## 2018-12-24 MED ORDER — SODIUM CHLORIDE 0.9% FLUSH: 3.0000 mL | Freq: Two times a day (BID) | INTRAVENOUS | Status: DC

## 2018-12-24 MED ORDER — MIDAZOLAM HCL 5 MG/5ML IJ SOLN
INTRAMUSCULAR | Status: AC
  Filled 2018-12-24: qty 5

## 2018-12-24 MED ORDER — FENTANYL CITRATE (PF) 100 MCG/2ML IJ SOLN
INTRAMUSCULAR | Status: DC | PRN
  Administered 2018-12-24: 12.5 ug via INTRAVENOUS
  Administered 2018-12-24 (×2): 25 ug via INTRAVENOUS

## 2018-12-24 SURGICAL SUPPLY — 10 items
CATH JOSEPH QUAD ALLRED 6F REP (CATHETERS) ×2 IMPLANT
CATH WEBSTER BI DIR CS D-F CRV (CATHETERS) ×1 IMPLANT
PACK EP LATEX FREE (CUSTOM PROCEDURE TRAY) ×1
PACK EP LF (CUSTOM PROCEDURE TRAY) ×1 IMPLANT
PAD PRO RADIOLUCENT 2001M-C (PAD) ×2 IMPLANT
PATCH CARTO3 (PAD) ×1 IMPLANT
SHEATH PINNACLE 6F 10CM (SHEATH) ×2 IMPLANT
SHEATH PINNACLE 7F 10CM (SHEATH) ×1 IMPLANT
SHEATH PINNACLE 8F 10CM (SHEATH) ×1 IMPLANT
SHEATH PROBE COVER 6X72 (BAG) ×1 IMPLANT

## 2018-12-24 NOTE — Progress Notes (Signed)
Site area: right groin left groin  Site Prior to Removal:  Level 0  Pressure Applied For 20 MINUTES    Minutes Beginning at 1350  Manual:   Yes.    Patient Status During Pull:  Stable   Post Pull Groin Site:  Level l0  Post Pull Instructions Given:  Yes.    Post Pull Pulses Present:  Yes.    Dressing Applied:  Yes.    Comments:  Bed rest for 4 hr

## 2018-12-24 NOTE — Discharge Instructions (Addendum)
Post procedure care instructions No driving for 4 days. No lifting over 5 lbs for 1 week. No vigorous or sexual activity for 1 week. You may return to work on 12/31/2018. Keep procedure site clean & dry. If you notice increased pain, swelling, bleeding or pus, call/return!  You may shower, but no soaking baths/hot tubs/pools for 1 week.       Femoral Site Care This sheet gives you information about how to care for yourself after your procedure. Your health care provider may also give you more specific instructions. If you have problems or questions, contact your health care provider. What can I expect after the procedure? After the procedure, it is common to have:  Bruising that usually fades within 1-2 weeks.  Tenderness at the site. Follow these instructions at home: Wound care  Follow instructions from your health care provider about how to take care of your insertion site. Make sure you: ? Wash your hands with soap and water before you change your bandage (dressing). If soap and water are not available, use hand sanitizer. ? Change your dressing as told by your health care provider. ? Leave stitches (sutures), skin glue, or adhesive strips in place. These skin closures may need to stay in place for 2 weeks or longer. If adhesive strip edges start to loosen and curl up, you may trim the loose edges. Do not remove adhesive strips completely unless your health care provider tells you to do that.  Do not take baths, swim, or use a hot tub until your health care provider approves.  You may shower 24-48 hours after the procedure or as told by your health care provider. ? Gently wash the site with plain soap and water. ? Pat the area dry with a clean towel. ? Do not rub the site. This may cause bleeding.  Do not apply powder or lotion to the site. Keep the site clean and dry.  Check your femoral site every day for signs of infection. Check for: ? Redness, swelling, or pain. ? Fluid or  blood. ? Warmth. ? Pus or a bad smell. Activity  For the first 2-3 days after your procedure, or as long as directed: ? Avoid climbing stairs as much as possible. ? Do not squat.  Do not lift anything that is heavier than 10 lb (4.5 kg), or the limit that you are told, until your health care provider says that it is safe.  Rest as directed. ? Avoid sitting for a long time without moving. Get up to take short walks every 1-2 hours.  Do not drive for 24 hours if you were given a medicine to help you relax (sedative). General instructions  Take over-the-counter and prescription medicines only as told by your health care provider.  Keep all follow-up visits as told by your health care provider. This is important. Contact a health care provider if you have:  A fever or chills.  You have redness, swelling, or pain around your insertion site. Get help right away if:  The catheter insertion area swells very fast.  You pass out.  You suddenly start to sweat or your skin gets clammy.  The catheter insertion area is bleeding, and the bleeding does not stop when you hold steady pressure on the area.  The area near or just beyond the catheter insertion site becomes pale, cool, tingly, or numb. These symptoms may represent a serious problem that is an emergency. Do not wait to see if the symptoms will  go away. Get medical help right away. Call your local emergency services (911 in the [place] Do not drive yourself to the hospital. Summary  After the procedure, it is common to have bruising that usually fades within 1-2 weeks.  Check your femoral site every day for signs of infection.  Do not lift anything that is heavier than 10 lb (4.5 kg), or the limit that you are told, until your health care provider says that it is safe. This information is not intended to replace advice given to you by your health care provider. Make sure you discuss any questions you have with your health care  provider. Document Released: 11/11/2013 Document Revised: 03/23/2017 Document Reviewed: 03/23/2017 Elsevier Patient Education  2020 [employer]   Moderate Conscious Sedation, Adult, Care After These instructions provide you with information about caring for yourself after your procedure. Your health care provider may also give you more specific instructions. Your treatment has been planned according to current medical practices, but problems sometimes occur. Call your health care provider if you have any problems or questions after your procedure. What can I expect after the procedure? After your procedure, it is common:  To feel sleepy for several hours.  To feel clumsy and have poor balance for several hours.  To have poor judgment for several hours.  To vomit if you eat too soon. Follow these instructions at home: For at least 24 hours after the procedure:   Do not: ? Participate in activities where you could fall or become injured. ? Drive. ? Use heavy machinery. ? Drink alcohol. ? Take sleeping pills or medicines that cause drowsiness. ? Make important decisions or sign legal documents. ? Take care of children on your own.  Rest. Eating and drinking  Follow the diet recommended by your health care provider.  If you vomit: ? Drink water, juice, or soup when you can drink without vomiting. ? Make sure you have little or no nausea before eating solid foods. General instructions  Have a responsible adult stay with you until you are awake and alert.  Take over-the-counter and prescription medicines only as told by your health care provider.  If you smoke, do not smoke without supervision.  Keep all follow-up visits as told by your health care provider. This is important. Contact a health care provider if:  You keep feeling nauseous or you keep vomiting.  You feel light-headed.  You develop a rash.  You have a fever. Get help right away if:  You have  trouble breathing. This information is not intended to replace advice given to you by your health care provider. Make sure you discuss any questions you have with your health care provider. Document Released: 12/29/2012 Document Revised: 02/20/2017 Document Reviewed: 06/30/2015 Elsevier Patient Education  2020 [employer].

## 2018-12-24 NOTE — H&P (Signed)
Electrophysiology Office Note   Date:  12/24/2018   ID:  Daniel Golden, DOB [birth year], MRN [id number]  PCP:  Patient, No Pcp Per  Cardiologist:  Daniel Golden Primary Electrophysiologist:  Daniel Stayer Meredith Leeds, MD    No chief complaint on file.    History of Present Illness: Daniel Golden is a 28 y.o. male who is being seen today for the evaluation of SVT at the request of No ref. provider found. Presenting today for electrophysiology evaluation.  He presented to the hospital 10/28/2018 with palpitations and chest pain.  He had a abnormal ECG with mild ST elevations and T wave inversions and had a left heart catheterization that showed no evidence of coronary disease with possible coronary spasm.  He continued to have episodic palpitations with SVT noted on telemetry.  He was placed on a beta-blocker and discharged from the hospital.  Since hospital discharge, he has had no further episodes of SVT.  Today, denies symptoms of palpitations, chest pain, shortness of breath, orthopnea, PND, lower extremity edema, claudication, dizziness, presyncope, syncope, bleeding, or neurologic sequela. The patient is tolerating medications without difficulties.      Past Medical History:  Diagnosis Date  . Headache    Past Surgical History:  Procedure Laterality Date  . EYE SURGERY    . HERNIA REPAIR    . LEFT HEART CATH AND CORONARY ANGIOGRAPHY N/A 10/29/2018   Procedure: LEFT HEART CATH AND CORONARY ANGIOGRAPHY;  Surgeon: Daniel Bush, MD;  Location: [facility];  Service: Cardiovascular;  Laterality: N/A;     Current Facility-Administered Medications  Medication Dose Route Frequency Provider Last Rate Last Dose  . 0.9 %  sodium chloride infusion   Intravenous Continuous Daniel Haw, MD 50 mL/hr at 12/24/18 0900      Allergies:   Patient has no known allergies.   Social History:  The patient  reports that he has never smoked. He has never used smokeless tobacco. He  reports that he does not drink alcohol or use drugs.   Family History:  The patient's family history includes Diabetes in his father and mother; Heart failure in his father and mother.    ROS:  Please see the history of present illness.   Otherwise, review of systems is positive for none.   All other systems are reviewed and negative.    PHYSICAL EXAM: VS:  BP 123/79   Pulse (!) 51   Temp 97.6 F (36.4 C) (Oral)   Resp 16   Ht 5\' 9"  (1.753 m)   Wt 63.5 kg   SpO2 100%   BMI 20.67 kg/m  , BMI Body mass index is 20.67 kg/m. GEN: Well nourished, well developed, in no acute distress  HEENT: normal  Neck: no JVD, carotid bruits, or masses Cardiac: RRR; no murmurs, rubs, or gallops,no edema  Respiratory:  clear to auscultation bilaterally, normal work of breathing GI: soft, nontender, nondistended, + BS MS: no deformity or atrophy  Skin: warm and dry, Neuro:  Strength and sensation are intact Psych: euthymic mood, full affect  Recent Labs: 10/28/2018: TSH 0.778 12/21/2018: BUN 12; Creatinine, Ser 1.09; Hemoglobin 15.4; Platelets 190; Potassium 4.4; Sodium 139    Lipid Panel     Component Value Date/Time   CHOL 108 10/29/2018 0425   TRIG 53 10/29/2018 0425   HDL 31 (L) 10/29/2018 0425   CHOLHDL 3.5 10/29/2018 0425   VLDL 11 10/29/2018 0425   LDLCALC 66 10/29/2018 0425     Wt  Readings from Last 3 Encounters:  12/24/18 63.5 kg  11/19/18 65.3 kg  10/29/18 63.5 kg      Other studies Reviewed: Additional studies/ records that were reviewed today include: Zio 11/03/18  Review of the above records today demonstrates:  Heart rate 72 beats per minutes, sinus rhythm.   No critical or serious events occurred.   Symptoms of fluttering or skipped beats associated with sinus rhythm. 0 atrial fibrillation noted.  TTE 10/28/18 1. The left ventricle has normal systolic function with an ejection fraction of 60-65%. The cavity size was normal. Left ventricular diastolic parameters were  normal.  2. The right ventricle has normal systolic function. The cavity was normal.  3. The tricuspid valve is grossly normal.  4. The aortic valve was not well visualized. No stenosis of the aortic valve.  5. The aorta is normal in size and structure.  6. Technically difficult; normal LV function.  ASSESSMENT AND PLAN:  1.  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 Daniel Golden plan ablation  Daniel Golden has presented today for surgery, with the diagnosis of SVT.  The various methods of treatment have been discussed with the patient and family. After consideration of risks, benefits and other options for treatment, the patient has consented to  Procedure(s): Catheter ablation as a surgical intervention .  Risks include but not limited to bleeding, tamponade, heart block, stroke, damage to surrounding organs, among others. The patient's history has been reviewed, patient examined, no change in status, stable for surgery.  I have reviewed the patient's chart and labs.  Questions were answered to the patient's satisfaction.    Daniel Fowers Elberta Fortis, MD 12/24/2018 10:24 AM

## 2018-12-27 ENCOUNTER — Encounter (HOSPITAL_COMMUNITY): Payer: Self-pay | Admitting: Cardiology

## 2019-01-01 ENCOUNTER — Telehealth: Payer: Self-pay | Admitting: Physician Assistant

## 2019-01-01 NOTE — Telephone Encounter (Signed)
Paged by answering service, patient had an episode of palpitation this morning which woke him up from sleep.  Lasted for about 5 to 10 minutes.  Afterward he has chest pain.  Symptoms similar to recent SVT episode.  During my evaluation his heart rate was normal but having mild left upper chest pain.  He had normal coronaries by angiography a month ago.  His chest pain most likely due to elevated heart rate.  I have advised him to take Tylenol or ibuprofen.  No extraneous activity.  Symptoms worsens he will give [place] a call.  Advised to continue to take Cardizem CD 180 mg daily.  He may take PRN Metroprolol tartrate 25 mg as needed for elevated heart rate.  Discussed vagal maneuvers.  He will give [place] call if recurrent or worsening symptoms.  He was appreciative of call.

## 2019-01-05 NOTE — Telephone Encounter (Signed)
Followed up with pt per Dr. Curt Bears request.  Pt reports doing well, has slept well the last few nights and experienced no further episodes of palpitations. Advised to call office back if experiences again and we can increase Diltiazem to 240 mg daily (per [facility], if pt needs it). Patient verbalized understanding and agreeable to plan.

## 2019-01-17 ENCOUNTER — Telehealth: Payer: Self-pay | Admitting: Cardiology

## 2019-01-17 NOTE — Telephone Encounter (Signed)
°  Pt c/o medication issue:  1. Name of Medication: diltiazem (CARDIZEM CD) 180 MG 24 hr capsule  2. How are you currently taking this medication (dosage and times per day)? As directed  3. Are you having a reaction (difficulty breathing--STAT)? No   4. What is your medication issue? He wanted to know if he could go back to taking the metoprolol.  He feels like there are periods of time when his heart races on this medication, and he felt more like himself when he was on the metoprolol.

## 2019-01-17 NOTE — Telephone Encounter (Signed)
Reviewed w/ Camnitz.  Pt advised to continue plan arranged earlier today w/ triage nurse.  Advised to call office if he has any further issues before scheduled appt next week w/ Camnitz. Patient verbalized understanding and agreeable to plan.

## 2019-01-17 NOTE — Telephone Encounter (Signed)
Per pt on occasion has episode of fast heart rate and was wanting to go back on Metoprolol Reviewed pt's chart and noted in another phone note from [place] pt may take Metoprolol 25 mg  as needed also in another phone note Dr Curt Bears suggested increasing Diltiazem to 240 mg Discussed with pt and will try and take Metoprolol 25 mg as needed and if notes that has to take frequently will call back. Will forward to Dr Curt Bears for review .Adonis Housekeeper

## 2019-01-24 ENCOUNTER — Other Ambulatory Visit: Payer: Self-pay

## 2019-01-24 ENCOUNTER — Ambulatory Visit (INDEPENDENT_AMBULATORY_CARE_PROVIDER_SITE_OTHER): Payer: BC Managed Care – PPO | Admitting: Cardiology

## 2019-01-24 ENCOUNTER — Encounter: Payer: Self-pay | Admitting: Cardiology

## 2019-01-24 VITALS — BP 116/70 | HR 60 | Ht 69.0 in | Wt 138.0 lb

## 2019-01-24 DIAGNOSIS — I471 Supraventricular tachycardia: Secondary | ICD-10-CM | POA: Diagnosis not present

## 2019-01-24 NOTE — Progress Notes (Signed)
Electrophysiology Office Note   Date:  01/24/2019   ID:  Daniel Golden, DOB 1990/12/01, MRN [id number]  PCP:  Patient, No Pcp Per  Cardiologist:  Eden Emms Primary Electrophysiologist:  [name], MD    No chief complaint on file.    History of Present Illness: Daniel Golden is a 28 y.o. male who is being seen today for the evaluation of SVT at the request of No ref. provider found. Presenting today for electrophysiology evaluation.  He presented to the hospital 10/28/2018 with palpitations and chest pain.  He had a abnormal ECG with mild ST elevations and T wave inversions and had a left heart catheterization that showed no evidence of coronary disease with possible coronary spasm.  He continued to have episodic palpitations with SVT noted on telemetry.  He was placed on a beta-blocker and discharged from the hospital.  Since hospital discharge, he has had no further episodes of SVT.  He underwent EP study on 20, but unfortunately was noninducible.  Today, denies symptoms of palpitations, chest pain, shortness of breath, orthopnea, PND, lower extremity edema, claudication, dizziness, presyncope, syncope, bleeding, or neurologic sequela. The patient is tolerating medications without difficulties.  He is noted no further episodes since the negative EP study currently on diltiazem.   Past Medical History:  Diagnosis Date  . Headache    Past Surgical History:  Procedure Laterality Date  . EYE SURGERY    . HERNIA REPAIR    . LEFT HEART CATH AND CORONARY ANGIOGRAPHY N/A 10/29/2018   Procedure: LEFT HEART CATH AND CORONARY ANGIOGRAPHY;  Surgeon: Yvonne Kendall, MD;  Location: [facility];  Service: Cardiovascular;  Laterality: N/A;  . SVT ABLATION N/A 12/24/2018   Procedure: SVT ABLATION;  Surgeon: Regan Lemming, MD;  Location: [facility];  Service: Cardiovascular;  Laterality: N/A;     Current Outpatient Medications  Medication Sig Dispense Refill   . acetaminophen (TYLENOL) 500 MG tablet Take 500 mg by mouth every 6 (six) hours as needed for moderate pain or headache.    Marland Kitchen alum & mag hydroxide-simeth (MAALOX MAX) 400-400-40 MG/5ML suspension Take 5 mLs by mouth every 6 (six) hours as needed for indigestion. 355 mL 0  . cetirizine-pseudoephedrine (ZYRTEC-D) 5-120 MG tablet Take 1 tablet by mouth daily as needed for allergies.    Marland Kitchen diltiazem (CARDIZEM CD) 180 MG 24 hr capsule Take 1 capsule (180 mg total) by mouth daily. 30 capsule 11  . famotidine (PEPCID) 20 MG tablet Take 1 tablet (20 mg total) by mouth 2 (two) times daily. (Patient taking differently: Take 20 mg by mouth 2 (two) times daily as needed for heartburn. ) 30 tablet 0   No current facility-administered medications for this visit.     Allergies:   Patient has no known allergies.   Social History:  The patient  reports that he has never smoked. He has never used smokeless tobacco. He reports that he does not drink alcohol or use drugs.   Family History:  The patient's family history includes Diabetes in his father and mother; Heart failure in his father and mother.    ROS:  Please see the history of present illness.   Otherwise, review of systems is positive for none.   All other systems are reviewed and negative.   PHYSICAL EXAM: VS:  BP 116/70   Pulse 60   Ht 5\' 9"  (1.753 m)   Wt 138 lb (62.6 kg)   SpO2 99%  BMI 20.38 kg/m  , BMI Body mass index is 20.38 kg/m. GEN: Well nourished, well developed, in no acute distress  HEENT: normal  Neck: no JVD, carotid bruits, or masses Cardiac: RRR; no murmurs, rubs, or gallops,no edema  Respiratory:  clear to auscultation bilaterally, normal work of breathing GI: soft, nontender, nondistended, + BS MS: no deformity or atrophy  Skin: warm and dry Neuro:  Strength and sensation are intact Psych: euthymic mood, full affect  EKG:  EKG is ordered today. Personal review of the ekg ordered shows sinus rhythm, rate 60    Recent Labs: 10/28/2018: TSH 0.778 12/21/2018: BUN 12; Creatinine, Ser 1.09; Hemoglobin 15.4; Platelets 190; Potassium 4.4; Sodium 139    Lipid Panel     Component Value Date/Time   CHOL 108 10/29/2018 0425   TRIG 53 10/29/2018 0425   HDL 31 (L) 10/29/2018 0425   CHOLHDL 3.5 10/29/2018 0425   VLDL 11 10/29/2018 0425   LDLCALC 66 10/29/2018 0425     Wt Readings from Last 3 Encounters:  01/24/19 138 lb (62.6 kg)  12/24/18 140 lb (63.5 kg)  11/19/18 144 lb (65.3 kg)      Other studies Reviewed: Additional studies/ records that were reviewed today include: Zio 11/03/18  Review of the above records today demonstrates:  Heart rate 72 beats per minutes, sinus rhythm.   No critical or serious events occurred.   Symptoms of fluttering or skipped beats associated with sinus rhythm. 0 atrial fibrillation noted.  TTE 10/28/18 1. The left ventricle has normal systolic function with an ejection fraction of 60-65%. The cavity size was normal. Left ventricular diastolic parameters were normal.  2. The right ventricle has normal systolic function. The cavity was normal.  3. The tricuspid valve is grossly normal.  4. The aortic valve was not well visualized. No stenosis of the aortic valve.  5. The aorta is normal in size and structure.  6. Technically difficult; normal LV function.  ASSESSMENT AND PLAN:  1.  SVT: Had multiple episodes in the past.  He underwent EP study, though he was noninducible.  Mains on diltiazem with minimal symptoms.  No changes at this time.    Current medicines are reviewed at length with the patient today.   The patient does not have concerns regarding his medicines.  The following changes were made today: None  Labs/ tests ordered today include:  Orders Placed This Encounter  Procedures  . EKG 12-Lead     Disposition:   FU with Antino Mayabb 6 months  Signed, Pranshu Lyster Meredith Leeds, MD  01/24/2019 3:25 PM     [facility] [address]  [place] [address] [phone] (office) [phone] (fax)

## 2019-01-24 NOTE — Patient Instructions (Signed)
Medication Instructions:  Your physician recommends that you continue on your current medications as directed. Please refer to the Current Medication list given to you today.  * If you need a refill on your cardiac medications before your next appointment, please call your pharmacy.   Labwork: None ordered  Testing/Procedures: None ordered  Follow-Up: At [facility], you and your health needs are our priority.  As part of our continuing mission to provide you with exceptional heart care, we have created designated Provider Care Teams.  These Care Teams include your primary Cardiologist (physician) and Advanced Practice Providers (APPs -  Physician Assistants and Nurse Practitioners) who all work together to provide you with the care you need, when you need it.  You will need a follow up appointment in 6 months.  Please call our office 2 months in advance to schedule this appointment.  You may see Dr Curt Bears or one of the following Advanced Practice Providers on your designated Care Team:    Chanetta Marshall, NP  Tommye Standard, PA-C  Oda Kilts, [place]  Thank you for choosing [facility]!!   Trinidad Curet, RN [phone]  Any Other Special Instructions Will Be Listed Below (If Applicable).

## 2019-07-30 ENCOUNTER — Ambulatory Visit
Admission: EM | Admit: 2019-07-30 | Discharge: 2019-07-30 | Disposition: A | Discharge status: Home / Self Care | Payer: BC Managed Care – PPO | Attending: Emergency Medicine | Admitting: Emergency Medicine

## 2019-07-30 ENCOUNTER — Encounter: Payer: Self-pay | Admitting: Emergency Medicine

## 2019-07-30 ENCOUNTER — Other Ambulatory Visit: Payer: Self-pay

## 2019-07-30 DIAGNOSIS — M2142 Flat foot [pes planus] (acquired), left foot: Secondary | ICD-10-CM | POA: Diagnosis not present

## 2019-07-30 DIAGNOSIS — M2141 Flat foot [pes planus] (acquired), right foot: Secondary | ICD-10-CM

## 2019-07-30 MED ORDER — NAPROXEN 500 MG PO TABS: 500.0000 mg | ORAL_TABLET | Freq: Two times a day (BID) | ORAL | 0 refills | Status: DC

## 2019-07-30 NOTE — ED Provider Notes (Signed)
[facility]    CSN: [id number] Arrival date & time: 07/30/19  1322      History   Chief Complaint Chief Complaint  Patient presents with  . Ankle Pain    HPI Daniel Golden is a 29 y.o. male with history of SVT, headaches presenting for bilateral ankle and foot pain.  States is been ongoing for the last 3 to 4 months.  States it worsens throughout the day.  Has to do a lot of standing and walking at work.  Denies injury, fall.  Patient does have foot and ankle appointment on 5/19.  Has not take anything for this.   Past Medical History:  Diagnosis Date  . Headache     Patient Active Problem List   Diagnosis Date Noted  . Chest pain of uncertain etiology   . SVT (supraventricular tachycardia) ([place])   . Abnormal EKG   . Palpitations 10/28/2018    Past Surgical History:  Procedure Laterality Date  . EYE SURGERY    . HERNIA REPAIR    . LEFT HEART CATH AND CORONARY ANGIOGRAPHY N/A 10/29/2018   Procedure: LEFT HEART CATH AND CORONARY ANGIOGRAPHY;  Surgeon: Nelva Bush, MD;  Location: [facility];  Service: Cardiovascular;  Laterality: N/A;  . SVT ABLATION N/A 12/24/2018   Procedure: SVT ABLATION;  Surgeon: Constance Haw, MD;  Location: [facility];  Service: Cardiovascular;  Laterality: N/A;       Home Medications    Prior to Admission medications   Medication Sig Start Date End Date Taking? Authorizing Provider  cetirizine-pseudoephedrine (ZYRTEC-D) 5-120 MG tablet Take 1 tablet by mouth daily as needed for allergies.   Yes [provider]  diltiazem (CARDIZEM CD) 180 MG 24 hr capsule Take 1 capsule (180 mg total) by mouth daily. 12/24/18 12/24/19 Yes Baldwin [place], PA-C  acetaminophen (TYLENOL) 500 MG tablet Take 500 mg by mouth every 6 (six) hours as needed for moderate pain or headache.    [provider]  alum & mag hydroxide-simeth (MAALOX MAX) 400-400-40 MG/5ML suspension Take 5 mLs by mouth every 6 (six)  hours as needed for indigestion. 10/23/18   Caccavale, Sophia, PA-C  naproxen (NAPROSYN) 500 MG tablet Take 1 tablet (500 mg total) by mouth 2 (two) times daily. 07/30/19   Hall-Potvin, [place], PA-C  famotidine (PEPCID) 20 MG tablet Take 1 tablet (20 mg total) by mouth 2 (two) times daily. Patient taking differently: Take 20 mg by mouth 2 (two) times daily as needed for heartburn.  10/25/18 07/30/19  Okey Regal, PA-C    Family History Family History  Problem Relation Age of Onset  . Diabetes Mother   . Heart failure Mother   . Diabetes Father   . Heart failure Father     Social History Social History   Tobacco Use  . Smoking status: Never Smoker  . Smokeless tobacco: Never Used  Substance Use Topics  . Alcohol use: No  . Drug use: No     Allergies   Patient has no known allergies.   Review of Systems As per HPI   Physical Exam Triage Vital Signs ED Triage Vitals  Enc Vitals Group     BP 07/30/19 1353 120/76     Pulse Rate 07/30/19 1353 60     Resp 07/30/19 1353 16     Temp 07/30/19 1353 98.2 F (36.8 C)     Temp Source 07/30/19 1353 Oral     SpO2 07/30/19 1353  97 %     Weight --      Height --      Head Circumference --      Peak Flow --      Pain Score 07/30/19 1400 9     Pain Loc --      Pain Edu? --      Excl. in GC? --    No data found.  Updated Vital Signs BP 120/76 (BP Location: Left Arm)   Pulse 60   Temp 98.2 F (36.8 C) (Oral)   Resp 16   SpO2 97%   Visual Acuity Right Eye Distance:   Left Eye Distance:   Bilateral Distance:    Right Eye Near:   Left Eye Near:    Bilateral Near:     Physical Exam Constitutional:      General: He is not in acute distress. HENT:     Head: Normocephalic and atraumatic.  Eyes:     General: No scleral icterus.    Pupils: Pupils are equal, round, and reactive to light.  Cardiovascular:     Rate and Rhythm: Normal rate.  Pulmonary:     Effort: Pulmonary effort is normal. No respiratory distress.      Breath sounds: No wheezing.  Musculoskeletal:     Comments: Mildly decreased dorsiflexion of bilateral ankles.  Patient has significant pes planus (L>R) with pronation.  Patient does have plantar tenderness along flexor hallux longus without crepitus, edema.  Skin:    Coloration: Skin is not jaundiced or pale.  Neurological:     Mental Status: He is alert and oriented to person, place, and time.      UC Treatments / Results  Labs (all labs ordered are listed, but only abnormal results are displayed) Labs Reviewed - No data to display  EKG   Radiology No results found.  Procedures Procedures (including critical care time)  Medications Ordered in UC Medications - No data to display  Initial Impression / Assessment and Plan / UC Course  I have reviewed the triage vital signs and the nursing notes.  Pertinent labs & imaging results that were available during my care of the patient were reviewed by me and considered in my medical decision making (see chart for details).     Patient has severe pes planus bilaterally.  Has foot and ankle appointment on 5/19: Intends to keep appointment.  Will trial NSAID, RICE, elevation after work and during work breaks in the interim.  Will likely need orthotics.  Return precautions discussed, patient verbalized understanding and is agreeable to plan. Final Clinical Impressions(s) / UC Diagnoses   Final diagnoses:  Pes planus of both feet     Discharge Instructions     Take naproxen twice daily with food. Important to ice feet and elevate during work breaks and after work. Do ankle stretches provided. Keep foot and ankle appointment on 5/19.    ED Prescriptions    Medication Sig Dispense Auth. Provider   naproxen (NAPROSYN) 500 MG tablet Take 1 tablet (500 mg total) by mouth 2 (two) times daily. 30 tablet Hall-Potvin, [place], PA-C     PDMP not reviewed this encounter.   Hall-Potvin, [place], [place] 07/30/19 1519

## 2019-07-30 NOTE — ED Triage Notes (Signed)
bilateral lateral ankle pain, radiating to lateral foot.  Pain has occurred for 3-4 months.  Pain worsens throughout the day.  Patient stands most of the time at work

## 2019-07-30 NOTE — Discharge Instructions (Addendum)
Take naproxen twice daily with food. Important to ice feet and elevate during work breaks and after work. Do ankle stretches provided. Keep foot and ankle appointment on 5/19.

## 2019-08-02 ENCOUNTER — Ambulatory Visit
Admission: EM | Admit: 2019-08-02 | Discharge: 2019-08-02 | Discharge status: Absent without leave | Payer: BC Managed Care – PPO

## 2019-08-03 ENCOUNTER — Other Ambulatory Visit: Payer: Self-pay

## 2019-08-03 ENCOUNTER — Encounter: Payer: Self-pay | Admitting: Podiatry

## 2019-08-03 ENCOUNTER — Ambulatory Visit (INDEPENDENT_AMBULATORY_CARE_PROVIDER_SITE_OTHER): Payer: BC Managed Care – PPO

## 2019-08-03 ENCOUNTER — Ambulatory Visit: Payer: BC Managed Care – PPO | Admitting: Podiatry

## 2019-08-03 DIAGNOSIS — M79672 Pain in left foot: Secondary | ICD-10-CM | POA: Diagnosis not present

## 2019-08-03 DIAGNOSIS — M65172 Other infective (teno)synovitis, left ankle and foot: Secondary | ICD-10-CM | POA: Diagnosis not present

## 2019-08-03 DIAGNOSIS — M79671 Pain in right foot: Secondary | ICD-10-CM

## 2019-08-03 DIAGNOSIS — M65171 Other infective (teno)synovitis, right ankle and foot: Secondary | ICD-10-CM

## 2019-08-03 DIAGNOSIS — M659 Synovitis and tenosynovitis, unspecified: Secondary | ICD-10-CM | POA: Diagnosis not present

## 2019-08-03 DIAGNOSIS — M2141 Flat foot [pes planus] (acquired), right foot: Secondary | ICD-10-CM

## 2019-08-03 DIAGNOSIS — M2142 Flat foot [pes planus] (acquired), left foot: Secondary | ICD-10-CM

## 2019-08-03 NOTE — Patient Instructions (Signed)
Voltaren 1% gel.

## 2019-08-04 ENCOUNTER — Other Ambulatory Visit: Payer: Self-pay | Admitting: Podiatry

## 2019-08-04 DIAGNOSIS — M659 Synovitis and tenosynovitis, unspecified: Secondary | ICD-10-CM

## 2019-08-07 NOTE — Progress Notes (Signed)
   Subjective:  29 y.o. male presenting today as a new patient with a chief complaint of generalized foot pain bilaterally that began three months ago. He states the left is worse than the right and the symptoms are worsened by ambulation and activity. He denies any known trauma or injury. He has not had any treatment for the complaint. Patient is here for further evaluation and treatment.   Past Medical History:  Diagnosis Date  . Headache        Objective/Physical Exam General: The patient is alert and oriented x3 in no acute distress.  Dermatology: Skin is warm, dry and supple bilateral lower extremities. Negative for open lesions or macerations.  Vascular: Palpable pedal pulses bilaterally. No edema or erythema noted. Capillary refill within normal limits.  Neurological: Epicritic and protective threshold grossly intact bilaterally.   Musculoskeletal Exam: Range of motion within normal limits to all pedal and ankle joints bilateral. Muscle strength 5/5 in all groups bilateral.  Upon weightbearing there is a medial longitudinal arch collapse bilaterally. Remove foot valgus noted to the bilateral lower extremities with excessive pronation upon mid stance. Pain with palpation noted to the medial, lateral and anterior aspects of the bilateral ankle joints.   Radiographic Exam:  Normal osseous mineralization. Joint spaces preserved. No fracture/dislocation/boney destruction.   Pes planus noted on radiographic exam lateral views. Decreased calcaneal inclination and metatarsal declination angle is noted. Anterior break in the cyma line noted on lateral views. Medial talar head to deviation noted on AP radiograph.   Assessment: 1. pes planus bilateral 2. Ankle synovitis bilateral    Plan of Care:  1. Patient was evaluated. X-Rays reviewed.  2. Injection of 0.5 mLs Celestone Soluspan injected into the bilateral ankle joints.  3. Patient on heart arhythmia medication and cannot take  NSAIDs.  4. Appointment with Pedorthist for custom molded orthotics.  5. Return to clinic in 3 months.   Works at [employer] that [employer] for [employer]. Mother's name is Charna Archer?    Felecia Shelling, DPM [facility]  Dr. Felecia Shelling, DPM    [address]                                        [place], [place] [address]                Office [phone]  Fax [phone]

## 2019-08-09 ENCOUNTER — Telehealth: Payer: Self-pay

## 2019-08-09 ENCOUNTER — Telehealth: Payer: Self-pay | Admitting: Cardiology

## 2019-08-09 NOTE — Telephone Encounter (Signed)
Patient called stated that he saw his cardiologist and was told that he is not on any blood thinners currently and cardiologist okayed patient to take Nsaids.  Please call in script that yall discussed at office visit the other day.  Thanks

## 2019-08-09 NOTE — Telephone Encounter (Signed)
Pt reports issues with swelling/pain since around February.  He has recently seen podiatry about the problem but he was reading up on this and also saw that Diltiazem may cause pedal/ankle edema. Pt made aware that since he has recently been seen, by podiatry, for bilateral foot pain/edema - that he should further discuss this w/ them first.  Aware that after reviewing w/ pharmacist it is not believed to be caused by Diltiazem at this time, but that if podiatry felt we needed to readdress then we can. Further clarified when we typically would see SE from medication. Pt will call back if needing to be re-addressed.

## 2019-08-09 NOTE — Telephone Encounter (Signed)
New message   Pt c/o medication issue:  1. Name of Medication: diltiazem (CARDIZEM CD) 180 MG 24 hr capsule  2. How are you currently taking this medication (dosage and times per day)? As written  3. Are you having a reaction (difficulty breathing--STAT)? No   4. What is your medication issue? Patient wants to know if this medication could be causing swelling in ankles and feet. Please call to discuss.

## 2019-08-10 ENCOUNTER — Ambulatory Visit: Payer: BC Managed Care – PPO | Admitting: Podiatry

## 2019-08-10 ENCOUNTER — Other Ambulatory Visit: Payer: Self-pay | Admitting: Podiatry

## 2019-08-10 MED ORDER — DICLOFENAC SODIUM 75 MG PO TBEC: 75.0000 mg | DELAYED_RELEASE_TABLET | Freq: Two times a day (BID) | ORAL | 1 refills | Status: DC

## 2019-08-10 NOTE — Progress Notes (Signed)
PRN pain and inflammation 

## 2019-08-10 NOTE — Telephone Encounter (Signed)
Rx diclofenac sent to pharmacy - Dr. Logan Bores

## 2019-08-12 ENCOUNTER — Telehealth: Payer: Self-pay | Admitting: *Deleted

## 2019-08-12 NOTE — Telephone Encounter (Signed)
Pt called states his cardiologist states he is not on a blood thinner and he can get pain medications

## 2019-08-12 NOTE — Telephone Encounter (Signed)
Unable to leave a message voicemail box is not set up. 

## 2019-08-25 ENCOUNTER — Ambulatory Visit (INDEPENDENT_AMBULATORY_CARE_PROVIDER_SITE_OTHER): Payer: BC Managed Care – PPO | Admitting: Orthotics

## 2019-08-25 ENCOUNTER — Other Ambulatory Visit: Payer: Self-pay

## 2019-08-25 DIAGNOSIS — M2141 Flat foot [pes planus] (acquired), right foot: Secondary | ICD-10-CM

## 2019-08-25 DIAGNOSIS — M79672 Pain in left foot: Secondary | ICD-10-CM | POA: Diagnosis not present

## 2019-08-25 DIAGNOSIS — M2142 Flat foot [pes planus] (acquired), left foot: Secondary | ICD-10-CM

## 2019-08-25 DIAGNOSIS — M659 Synovitis and tenosynovitis, unspecified: Secondary | ICD-10-CM

## 2019-08-25 NOTE — Progress Notes (Signed)
Patient presents today with a hx of PTTD/AAF.  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  Patient has medially shifted talus/navicular.  Goal is provide longitudinal arch support and RF stability.  Plan on deep heel cup, hug arch, wide foot orthosis w/ medial flange and varus correction for RF valgus deformity.  Patient educated in the progessive nature of PTTD and financial responsibility.  

## 2019-09-06 ENCOUNTER — Ambulatory Visit (INDEPENDENT_AMBULATORY_CARE_PROVIDER_SITE_OTHER): Payer: BC Managed Care – PPO | Admitting: Cardiology

## 2019-09-06 ENCOUNTER — Encounter: Payer: Self-pay | Admitting: Cardiology

## 2019-09-06 ENCOUNTER — Other Ambulatory Visit: Payer: Self-pay

## 2019-09-06 VITALS — BP 118/78 | HR 62 | Ht 69.0 in | Wt 155.0 lb

## 2019-09-06 DIAGNOSIS — I471 Supraventricular tachycardia: Secondary | ICD-10-CM

## 2019-09-06 NOTE — Progress Notes (Signed)
Electrophysiology Office Note   Date:  09/06/2019   ID:  Daniel Golden, DOB Aug 15, 1990, MRN [id number]  PCP:  Patient, No Pcp Per  Cardiologist:  Eden Emms Primary Electrophysiologist:  Betzaira Mentel Jorja Loa, MD    No chief complaint on file.    History of Present Illness: Daniel Golden is a 29 y.o. male who is being seen today for the evaluation of SVT at the request of No ref. provider found. Presenting today for electrophysiology evaluation.  He presented to the hospital 10/28/2018 with palpitations and chest pain.  He had a abnormal ECG with mild ST elevations and T wave inversions and had a left heart catheterization that showed no evidence of coronary disease with possible coronary spasm.  He continued to have episodic palpitations with SVT noted on telemetry.  He was placed on a beta-blocker and was discharged from the hospital.  Since discharge, he has had no further episodes of SVT.  He underwent negative EP study 12/24/2018.   Today, denies symptoms of palpitations, chest pain, shortness of breath, orthopnea, PND, lower extremity edema, claudication, dizziness, presyncope, syncope, bleeding, or neurologic sequela. The patient is tolerating medications without difficulties.  He is continue to feel well since last being seen.  He has had no further episodes of SVT.  He is able to do all of his daily activities.   Past Medical History:  Diagnosis Date  . Headache    Past Surgical History:  Procedure Laterality Date  . EYE SURGERY    . HERNIA REPAIR    . LEFT HEART CATH AND CORONARY ANGIOGRAPHY N/A 10/29/2018   Procedure: LEFT HEART CATH AND CORONARY ANGIOGRAPHY;  Surgeon: Yvonne Kendall, MD;  Location: [facility];  Service: Cardiovascular;  Laterality: N/A;  . SVT ABLATION N/A 12/24/2018   Procedure: SVT ABLATION;  Surgeon: Regan Lemming, MD;  Location: [facility];  Service: Cardiovascular;  Laterality: N/A;     Current Outpatient Medications   Medication Sig Dispense Refill  . acetaminophen (TYLENOL) 500 MG tablet Take 500 mg by mouth every 6 (six) hours as needed for moderate pain or headache.    . diclofenac (VOLTAREN) 75 MG EC tablet Take 1 tablet (75 mg total) by mouth 2 (two) times daily. 60 tablet 1  . diltiazem (CARDIZEM CD) 180 MG 24 hr capsule Take 1 capsule (180 mg total) by mouth daily. 30 capsule 11  . naproxen (NAPROSYN) 500 MG tablet Take 1 tablet (500 mg total) by mouth 2 (two) times daily. 30 tablet 0   No current facility-administered medications for this visit.    Allergies:   Patient has no known allergies.   Social History:  The patient  reports that he has never smoked. He has never used smokeless tobacco. He reports that he does not drink alcohol and does not use drugs.   Family History:  The patient's family history includes Diabetes in his father and mother; Heart failure in his father and mother.    ROS:  Please see the history of present illness.   Otherwise, review of systems is positive for none.   All other systems are reviewed and negative.   PHYSICAL EXAM: VS:  BP 118/78   Pulse 62   Ht 5\' 9"  (1.753 m)   Wt 155 lb (70.3 kg)   BMI 22.89 kg/m  , BMI Body mass index is 22.89 kg/m. GEN: Well nourished, well developed, in no acute distress  HEENT: normal  Neck: no JVD, carotid  bruits, or masses Cardiac: RRR; no murmurs, rubs, or gallops,no edema  Respiratory:  clear to auscultation bilaterally, normal work of breathing GI: soft, nontender, nondistended, + BS MS: no deformity or atrophy  Skin: warm and dry Neuro:  Strength and sensation are intact Psych: euthymic mood, full affect  EKG:  EKG is ordered today. Personal review of the ekg ordered shows sinus rhythm, rate 62    Recent Labs: 10/28/2018: TSH 0.778 12/21/2018: BUN 12; Creatinine, Ser 1.09; Hemoglobin 15.4; Platelets 190; Potassium 4.4; Sodium 139    Lipid Panel     Component Value Date/Time   CHOL 108 10/29/2018 0425    TRIG 53 10/29/2018 0425   HDL 31 (L) 10/29/2018 0425   CHOLHDL 3.5 10/29/2018 0425   VLDL 11 10/29/2018 0425   LDLCALC 66 10/29/2018 0425     Wt Readings from Last 3 Encounters:  09/06/19 155 lb (70.3 kg)  01/24/19 138 lb (62.6 kg)  12/24/18 140 lb (63.5 kg)      Other studies Reviewed: Additional studies/ records that were reviewed today include: [facility] 11/03/18  Review of the above records today demonstrates:  Heart rate 72 beats per minutes, sinus rhythm.   No critical or serious events occurred.   Symptoms of fluttering or skipped beats associated with sinus rhythm. 0 atrial fibrillation noted.  TTE 10/28/18 1. The left ventricle has normal systolic function with an ejection fraction of 60-65%. The cavity size was normal. Left ventricular diastolic parameters were normal.  2. The right ventricle has normal systolic function. The cavity was normal.  3. The tricuspid valve is grossly normal.  4. The aortic valve was not well visualized. No stenosis of the aortic valve.  5. The aorta is normal in size and structure.  6. Technically difficult; normal LV function.  ASSESSMENT AND PLAN:  1.  SVT: EP study 12/24/2018 which was found to be noninducible.  Currently on diltiazem.  He has fortunately had no further episodes of SVT.  We Rheta Hemmelgarn continue with current management.  He does wish to get off of the diltiazem at some point.  The next visit, may stop long-acting and start him on as needed dose.    Current medicines are reviewed at length with the patient today.   The patient does not have concerns regarding his medicines.  The following changes were made today: None  Labs/ tests ordered today include:  Orders Placed This Encounter  Procedures  . EKG 12-Lead     Disposition:   FU with Armanii Pressnell 12 months  Signed, Dorion Petillo Meredith Leeds, MD  09/06/2019 8:27 AM     [facility] [address] [place] [address] [phone] (office) [phone]  (fax)

## 2019-09-15 ENCOUNTER — Ambulatory Visit: Payer: BC Managed Care – PPO | Admitting: Orthotics

## 2019-09-15 ENCOUNTER — Other Ambulatory Visit: Payer: Self-pay

## 2019-09-15 DIAGNOSIS — M2141 Flat foot [pes planus] (acquired), right foot: Secondary | ICD-10-CM

## 2019-09-15 DIAGNOSIS — M79672 Pain in left foot: Secondary | ICD-10-CM

## 2019-09-15 NOTE — Progress Notes (Signed)
Patient came in today to p/up functional foot orthotics.   The orthotics were assessed to both fit and function.  The F/O addressed the biomechanical issues/pathologies as intended, offering good longitudinal arch support, proper offloading, and foot support. There weren't any signs of discomfort or irritation.  The F/O fit properly in footwear with minimal trimming/adjustments. 

## 2019-11-07 ENCOUNTER — Ambulatory Visit (INDEPENDENT_AMBULATORY_CARE_PROVIDER_SITE_OTHER): Payer: BC Managed Care – PPO | Admitting: Podiatry

## 2019-11-07 ENCOUNTER — Encounter: Payer: Self-pay | Admitting: Podiatry

## 2019-11-07 ENCOUNTER — Other Ambulatory Visit: Payer: Self-pay

## 2019-11-07 DIAGNOSIS — M659 Synovitis and tenosynovitis, unspecified: Secondary | ICD-10-CM

## 2019-11-07 DIAGNOSIS — M2141 Flat foot [pes planus] (acquired), right foot: Secondary | ICD-10-CM

## 2019-11-07 DIAGNOSIS — M79672 Pain in left foot: Secondary | ICD-10-CM | POA: Diagnosis not present

## 2019-11-07 DIAGNOSIS — M79671 Pain in right foot: Secondary | ICD-10-CM

## 2019-11-07 DIAGNOSIS — M2142 Flat foot [pes planus] (acquired), left foot: Secondary | ICD-10-CM

## 2019-11-07 MED ORDER — DICLOFENAC SODIUM 75 MG PO TBEC: 75.0000 mg | DELAYED_RELEASE_TABLET | Freq: Two times a day (BID) | ORAL | 3 refills | Status: DC

## 2019-11-07 NOTE — Progress Notes (Signed)
   Subjective:  29 y.o. male presenting today for follow-up evaluation and treatment regarding flatfeet with bilateral ankle pain.  Patient works on his feet 8-hour shifts all day.  He states that the injections only helped temporarily.  He did receive his custom molded orthotics which do help somewhat.  He presents today for further treatment and evaluation  Past Medical History:  Diagnosis Date  . Headache      Objective/Physical Exam General: The patient is alert and oriented x3 in no acute distress.  Dermatology: Skin is warm, dry and supple bilateral lower extremities. Negative for open lesions or macerations.  Vascular: Palpable pedal pulses bilaterally. No edema or erythema noted. Capillary refill within normal limits.  Neurological: Epicritic and protective threshold grossly intact bilaterally.   Musculoskeletal Exam: Range of motion within normal limits to all pedal and ankle joints bilateral. Muscle strength 5/5 in all groups bilateral.  Upon weightbearing there is a medial longitudinal arch collapse bilaterally. Remove foot valgus noted to the bilateral lower extremities with excessive pronation upon mid stance. Pain with palpation noted to the medial, lateral and anterior aspects of the bilateral ankle joints.   Assessment: 1. pes planus bilateral 2. Ankle synovitis bilateral   Plan of Care:  1. Patient was evaluated.  2.  Prescription for diclofenac 75 mg 2 times daily.  Refills will be provided as needed 3.  Continue custom molded orthotics 4.  Note was provided for work so he does not have to wear steel toed boots 5.  Return to clinic as needed  Works at a company that [employer] for [employer]. Mother's name is Charna Archer?    Felecia Shelling, DPM [facility]  Dr. Felecia Shelling, DPM    [address]                                        [place], [place] [address]                Office [phone]  Fax [phone]

## 2019-11-17 ENCOUNTER — Other Ambulatory Visit: Payer: Self-pay | Admitting: Physician Assistant

## 2020-01-12 DIAGNOSIS — H52203 Unspecified astigmatism, bilateral: Secondary | ICD-10-CM | POA: Diagnosis not present

## 2020-01-12 DIAGNOSIS — H5213 Myopia, bilateral: Secondary | ICD-10-CM | POA: Diagnosis not present

## 2020-01-12 DIAGNOSIS — H25042 Posterior subcapsular polar age-related cataract, left eye: Secondary | ICD-10-CM | POA: Diagnosis not present

## 2020-02-01 DIAGNOSIS — H2512 Age-related nuclear cataract, left eye: Secondary | ICD-10-CM | POA: Diagnosis not present

## 2020-02-10 ENCOUNTER — Ambulatory Visit (INDEPENDENT_AMBULATORY_CARE_PROVIDER_SITE_OTHER): Payer: BC Managed Care – PPO

## 2020-02-10 ENCOUNTER — Other Ambulatory Visit: Payer: Self-pay | Admitting: Podiatry

## 2020-02-10 ENCOUNTER — Other Ambulatory Visit: Payer: Self-pay

## 2020-02-10 ENCOUNTER — Ambulatory Visit (INDEPENDENT_AMBULATORY_CARE_PROVIDER_SITE_OTHER): Payer: BC Managed Care – PPO | Admitting: Podiatry

## 2020-02-10 ENCOUNTER — Encounter: Payer: Self-pay | Admitting: Podiatry

## 2020-02-10 DIAGNOSIS — M659 Synovitis and tenosynovitis, unspecified: Secondary | ICD-10-CM

## 2020-02-10 DIAGNOSIS — M779 Enthesopathy, unspecified: Secondary | ICD-10-CM

## 2020-02-10 DIAGNOSIS — M2141 Flat foot [pes planus] (acquired), right foot: Secondary | ICD-10-CM

## 2020-02-10 DIAGNOSIS — M2142 Flat foot [pes planus] (acquired), left foot: Secondary | ICD-10-CM

## 2020-02-10 MED ORDER — DICLOFENAC SODIUM 75 MG PO TBEC: 75.0000 mg | DELAYED_RELEASE_TABLET | Freq: Two times a day (BID) | ORAL | 3 refills | Status: DC

## 2020-02-10 NOTE — Progress Notes (Signed)
   Subjective:  29 y.o. male presenting today for follow-up evaluation and treatment regarding flatfeet with bilateral ankle pain.  Patient works on his feet 8-hour shifts all day.  Patient states that over the last 5 days has had an increased amount of pain and tenderness to the left ankle.  He denies injury.  He presents for further treatment and evaluation  Past Medical History:  Diagnosis Date  . Headache      Objective/Physical Exam General: The patient is alert and oriented x3 in no acute distress.  Dermatology: Skin is warm, dry and supple bilateral lower extremities. Negative for open lesions or macerations.  Vascular: Palpable pedal pulses bilaterally. No edema or erythema noted. Capillary refill within normal limits.  Neurological: Epicritic and protective threshold grossly intact bilaterally.   Musculoskeletal Exam: Range of motion within normal limits to all pedal and ankle joints bilateral. Muscle strength 5/5 in all groups bilateral.  Upon weightbearing there is a medial longitudinal arch collapse bilaterally. Remove foot valgus noted to the bilateral lower extremities with excessive pronation upon mid stance. Pain with palpation noted to the medial, lateral and anterior aspects of the bilateral ankle joints.   Radiographic exam: Normal osseous mineralization.  No fracture identified.  Joint spaces preserved.  Medial longitudinal arch collapse with a decreased calcaneal inclination angle and metatarsal declination angle noted consistent with pes planus deformity.  Assessment: 1. pes planus bilateral 2. Ankle synovitis bilateral   Plan of Care:  1. Patient was evaluated.  2.  Refill prescription for diclofenac 75 mg 2 times daily.   3.  Continue custom molded orthotics 4.  Note was provided for work so he does not have to wear steel toed boots 5.  Injection of 0.5 cc Celestone Soluspan injected into the left ankle  6.  Return to clinic in 6 weeks.  At this time we may  need to consider surgical reconstructive surgery of the flatfoot deformity and discuss it in detail to see if this is option that the patient would like to pursue.  Works at [employer] that [employer] for [employer]. Mother's name is Charna Archer?    Felecia Shelling, DPM [facility]  Dr. Felecia Shelling, DPM    [address]                                        [place], [place] [address]                Office [phone]  Fax [phone]

## 2020-03-06 DIAGNOSIS — E559 Vitamin D deficiency, unspecified: Secondary | ICD-10-CM | POA: Diagnosis not present

## 2020-03-06 DIAGNOSIS — Z0000 Encounter for general adult medical examination without abnormal findings: Secondary | ICD-10-CM | POA: Diagnosis not present

## 2020-03-06 DIAGNOSIS — Z1159 Encounter for screening for other viral diseases: Secondary | ICD-10-CM | POA: Diagnosis not present

## 2020-03-06 DIAGNOSIS — Z13220 Encounter for screening for lipoid disorders: Secondary | ICD-10-CM | POA: Diagnosis not present

## 2020-03-06 DIAGNOSIS — R5383 Other fatigue: Secondary | ICD-10-CM | POA: Diagnosis not present

## 2020-03-06 DIAGNOSIS — Z114 Encounter for screening for human immunodeficiency virus [HIV]: Secondary | ICD-10-CM | POA: Diagnosis not present

## 2020-03-06 DIAGNOSIS — Z87898 Personal history of other specified conditions: Secondary | ICD-10-CM | POA: Diagnosis not present

## 2020-03-06 DIAGNOSIS — Z131 Encounter for screening for diabetes mellitus: Secondary | ICD-10-CM | POA: Diagnosis not present

## 2020-03-26 ENCOUNTER — Ambulatory Visit (INDEPENDENT_AMBULATORY_CARE_PROVIDER_SITE_OTHER): Payer: BC Managed Care – PPO | Admitting: Podiatry

## 2020-03-26 ENCOUNTER — Other Ambulatory Visit: Payer: Self-pay

## 2020-03-26 DIAGNOSIS — M2142 Flat foot [pes planus] (acquired), left foot: Secondary | ICD-10-CM

## 2020-03-26 DIAGNOSIS — Q6689 Other  specified congenital deformities of feet: Secondary | ICD-10-CM | POA: Diagnosis not present

## 2020-03-26 DIAGNOSIS — M2141 Flat foot [pes planus] (acquired), right foot: Secondary | ICD-10-CM | POA: Diagnosis not present

## 2020-03-26 NOTE — Progress Notes (Signed)
   Subjective:  30 y.o. male presenting today for follow-up evaluation and treatment regarding flatfeet with bilateral ankle pain.  Patient works on his feet 8-hour shifts all day.  Patient states that over the last 5 days has had an increased amount of pain and tenderness to the left ankle.  He denies injury.  He presents for further treatment and evaluation  Past Medical History:  Diagnosis Date  . Headache      Objective/Physical Exam General: The patient is alert and oriented x3 in no acute distress.  Dermatology: Skin is warm, dry and supple bilateral lower extremities. Negative for open lesions or macerations.  Vascular: Palpable pedal pulses bilaterally. No edema or erythema noted. Capillary refill within normal limits.  Neurological: Epicritic and protective threshold grossly intact bilaterally.   Musculoskeletal Exam: Range of motion within normal limits to all pedal and ankle joints bilateral. Muscle strength 5/5 in all groups bilateral.  Upon weightbearing there is a medial longitudinal arch collapse bilaterally. Rearfoot valgus noted to the bilateral lower extremities with excessive pronation upon mid stance. The rearfoot to the left lower extremity is very rigid.  Minimal inversion and eversion noted suggestive of possible tarsal coalition Pain with palpation noted to the medial, lateral and anterior aspects of the bilateral ankle joints.   Assessment: 1. pes planus bilateral 2. Ankle synovitis bilateral  3.  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 Patient was evaluated.  2.  Continue oral diclofenac 75 mg 2 times daily as needed 3.  Continue custom molded orthotics 4.  Today we will order MRI of left heel without contrast to rule out tarsal coalition 5.  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 Mother's name is Charna Archer?     Felecia Shelling, DPM [facility]  Dr. Felecia Shelling, DPM    [address]                                        [place], [place] [address]                Office [phone]  Fax [phone]

## 2020-03-27 ENCOUNTER — Ambulatory Visit: Payer: BC Managed Care – PPO | Admitting: Podiatry

## 2020-04-05 ENCOUNTER — Telehealth: Payer: Self-pay | Admitting: *Deleted

## 2020-04-05 NOTE — Telephone Encounter (Signed)
   Primary Cardiologist: Dr. Elberta Fortis  Chart reviewed as part of pre-operative protocol coverage.   Simple dental extractions and cleaning are considered low risk procedures per guidelines and generally do not require any specific cardiac clearance. It is also generally accepted that for simple extractions and dental cleanings, there is no need to interrupt blood thinner therapy.  SBE prophylaxis is not required for the patient from a cardiac standpoint.  I will route this recommendation to the requesting party via Epic fax function and remove from pre-op pool.  Please call with questions.  Callback pool to arrange nonurgent visit with Dr. Elberta Fortis to discuss alternative to calcium channel blocker.   [place], [place] 04/05/2020, 3:13 PM

## 2020-04-05 NOTE — Telephone Encounter (Signed)
   [facility] Pre-operative Risk Assessment    HEARTCARE STAFF: - Please ensure there is not already an duplicate clearance open for this procedure. - Under Visit Info/Reason for Call, type in Other and utilize the format Clearance MM/DD/YY or Clearance TBD. Do not use dashes or single digits. - If request is for dental extraction, please clarify the # of teeth to be extracted.  Request for surgical clearance:  1. What type of surgery is being performed? DEEP CLEANING (SCALING AND ROOT PLANING)   2. When is this surgery scheduled? TBD   3. What type of clearance is required (medical clearance vs. Pharmacy clearance to hold med vs. Both)? MEDICAL  4. Are there any medications that need to be held prior to surgery and how long? NONE LISTED TO BE HELD; HOWEVER DR. 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 Pocahontas IS ASKING IF CALCIUM CHANNEL BLOCKER CAN BE CHANGED TO ANOTHER MEDICATION IN ORDER TO HELP WITH TREATMENT OF PERIODONTAL DISEASE  5. Practice name and name of physician performing surgery? DR. Mikey Bussing, DDS   6. What is the office phone number? [phone]   7.   What is the office fax number? [phone]  8.   Anesthesia type (None, local, MAC, general) ? NONE LISTED   Julaine Hua 04/05/2020, 12:22 PM  _________________________________________________________________   (provider comments below)

## 2020-04-05 NOTE — Telephone Encounter (Signed)
Tried calling patient Daniel Golden to get hi scheduled for an appointment with Dr. Elberta Fortis. I could not leave a voice message due to voice mailbox not set up. Will forward this to scheduling to have them schedule appointment.

## 2020-04-06 NOTE — Telephone Encounter (Signed)
Appt schedule 01/31 @ 8:45 with Dr Elberta Fortis

## 2020-04-23 ENCOUNTER — Encounter: Payer: Self-pay | Admitting: Cardiology

## 2020-04-23 ENCOUNTER — Ambulatory Visit (INDEPENDENT_AMBULATORY_CARE_PROVIDER_SITE_OTHER): Payer: BC Managed Care – PPO | Admitting: Cardiology

## 2020-04-23 ENCOUNTER — Other Ambulatory Visit: Payer: Self-pay

## 2020-04-23 VITALS — BP 104/78 | HR 76 | Ht 69.0 in | Wt 170.0 lb

## 2020-04-23 DIAGNOSIS — I471 Supraventricular tachycardia: Secondary | ICD-10-CM | POA: Diagnosis not present

## 2020-04-23 MED ORDER — DILTIAZEM HCL 30 MG PO TABS: 30.0000 mg | ORAL_TABLET | Freq: Four times a day (QID) | ORAL | 3 refills | Status: DC | PRN

## 2020-04-23 NOTE — Patient Instructions (Addendum)
Medication Instructions:  Your physician has recommended you make the following change in your medication:  1. STOP Diltiazem 180 mg 2. START Diltiazem 30 mg every 6 hours as NEEDED for elevated heart rates  *If you need a refill on your cardiac medications before your next appointment, please call your pharmacy*   Lab Work: None ordered   Testing/Procedures: None ordered   Follow-Up: At [facility], you and your health needs are our priority.  As part of our continuing mission to provide you with exceptional heart care, we have created designated Provider Care Teams.  These Care Teams include your primary Cardiologist (physician) and Advanced Practice Providers (APPs -  Physician Assistants and Nurse Practitioners) who all work together to provide you with the care you need, when you need it.  We recommend signing up for the patient portal called "MyChart".  Sign up information is provided on this After Visit Summary.  MyChart is used to connect with patients for Virtual Visits (Telemedicine).  Patients are able to view lab/test results, encounter notes, upcoming appointments, etc.  Non-urgent messages can be sent to your provider as well.   To learn more about what you can do with MyChart, go to [email or web address].    Your next appointment:   1 year(s)  The format for your next appointment:   In Person  Provider:   Loman Brooklyn, MD    Thank you for choosing [facility]!!   Dory Horn, RN [phone]   Other Instructions

## 2020-04-23 NOTE — Progress Notes (Signed)
Electrophysiology Office Note   Date:  04/23/2020   ID:  Daniel Golden, DOB 11/20/1990, MRN [id number]  PCP:  Patient, No Pcp Per  Cardiologist:  Eden Emms Primary Electrophysiologist:  Tuck Dulworth Jorja Loa, MD    No chief complaint on file.    History of Present Illness: Daniel Golden is a 30 y.o. male who is being seen today for the evaluation of SVT at the request of No ref. provider found. Presenting today for electrophysiology evaluation.  He presented to hospital 10/28/2018 with palpitations and chest pain.  He was found to have mild ST elevations.  Left heart catheterization showed no evidence of coronary artery disease and this was thought due to coronary spasm.  He continued to have palpitations with SVT noted on telemetry.  He was started on a beta-blocker and discharge from the hospital.  He had an EP study 12/24/2018 without inducible arrhythmia.  Today, denies symptoms of palpitations, chest pain, shortness of breath, orthopnea, PND, lower extremity edema, claudication, dizziness, presyncope, syncope, bleeding, or neurologic sequela. The patient is tolerating medications without difficulties.  He is currently feeling well.  He has no chest pain or shortness of breath.  Is able to do all of his daily activities without issue.  He has not had any further episodes of SVT.   Past Medical History:  Diagnosis Date  . Headache    Past Surgical History:  Procedure Laterality Date  . EYE SURGERY    . HERNIA REPAIR    . LEFT HEART CATH AND CORONARY ANGIOGRAPHY N/A 10/29/2018   Procedure: LEFT HEART CATH AND CORONARY ANGIOGRAPHY;  Surgeon: Yvonne Kendall, MD;  Location: [facility];  Service: Cardiovascular;  Laterality: N/A;  . SVT ABLATION N/A 12/24/2018   Procedure: SVT ABLATION;  Surgeon: Regan Lemming, MD;  Location: [facility];  Service: Cardiovascular;  Laterality: N/A;     Current Outpatient Medications  Medication Sig Dispense Refill  .  acetaminophen (TYLENOL) 500 MG tablet Take 500 mg by mouth every 6 (six) hours as needed for moderate pain or headache.    . diclofenac (VOLTAREN) 75 MG EC tablet Take 1 tablet (75 mg total) by mouth 2 (two) times daily. 60 tablet 3  . diltiazem (CARDIZEM) 30 MG tablet Take 1 tablet (30 mg total) by mouth 4 (four) times daily as needed (for elevated heart rates). 30 tablet 3  . naproxen (NAPROSYN) 500 MG tablet Take 1 tablet (500 mg total) by mouth 2 (two) times daily. 30 tablet 0   No current facility-administered medications for this visit.    Allergies:   Patient has no known allergies.   Social History:  The patient  reports that he has never smoked. He has never used smokeless tobacco. He reports that he does not drink alcohol and does not use drugs.   Family History:  The patient's family history includes Diabetes in his father and mother; Heart failure in his father and mother.    ROS:  Please see the history of present illness.   Otherwise, review of systems is positive for none.   All other systems are reviewed and negative.   PHYSICAL EXAM: VS:  BP 104/78   Pulse 76   Ht 5\' 9"  (1.753 m)   Wt 170 lb (77.1 kg)   SpO2 97%   BMI 25.10 kg/m  , BMI Body mass index is 25.1 kg/m. GEN: Well nourished, well developed, in no acute distress  HEENT: normal  Neck: no  JVD, carotid bruits, or masses Cardiac: RRR; no murmurs, rubs, or gallops,no edema  Respiratory:  clear to auscultation bilaterally, normal work of breathing GI: soft, nontender, nondistended, + BS MS: no deformity or atrophy  Skin: warm and dry Neuro:  Strength and sensation are intact Psych: euthymic mood, full affect  EKG:  EKG is ordered today. Personal review of the ekg ordered shows sinus rhythm, rate 61  Recent Labs: No results found for requested labs within last 8760 hours.    Lipid Panel     Component Value Date/Time   CHOL 108 10/29/2018 0425   TRIG 53 10/29/2018 0425   HDL 31 (L) 10/29/2018 0425    CHOLHDL 3.5 10/29/2018 0425   VLDL 11 10/29/2018 0425   LDLCALC 66 10/29/2018 0425     Wt Readings from Last 3 Encounters:  04/23/20 170 lb (77.1 kg)  09/06/19 155 lb (70.3 kg)  01/24/19 138 lb (62.6 kg)      Other studies Reviewed: Additional studies/ records that were reviewed today include: [facility] 11/03/18  Review of the above records today demonstrates:  Heart rate 72 beats per minutes, sinus rhythm.   No critical or serious events occurred.   Symptoms of fluttering or skipped beats associated with sinus rhythm. 0 atrial fibrillation noted.  TTE 10/28/18 1. The left ventricle has normal systolic function with an ejection fraction of 60-65%. The cavity size was normal. Left ventricular diastolic parameters were normal.  2. The right ventricle has normal systolic function. The cavity was normal.  3. The tricuspid valve is grossly normal.  4. The aortic valve was not well visualized. No stenosis of the aortic valve.  5. The aorta is normal in size and structure.  6. Technically difficult; normal LV function.  ASSESSMENT AND PLAN:  1.  SVT: EP study 12/24/2018 found to be noninducible.  Currently on diltiazem.  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  Due to that, we Jara Feider stop at diltiazem long-acting and give him diltiazem to take as needed.  He Dayshaun Whobrey call us back if he has further episodes.  2.  Preoperative evaluation: Patient has gingival foot surgery planned.  He does not have coronary artery disease or heart failure.  At this point, he would be at low risk for these intermediate risk procedures.  No further cardiac monitoring or testing at this point.  Current medicines are reviewed at length with the patient today.   The patient does not have concerns regarding his medicines.  The following changes were made today: Stop long-acting diltiazem, start as needed diltiazem  Labs/ tests ordered today include:  Orders Placed This  Encounter  Procedures  . EKG 12-Lead     Disposition:   FU with Shaina Gullatt 12 months  Signed, Charrise Lardner Jorja Loa, MD  04/23/2020 9:33 AM     [facility] [address] [place] [address] [phone] (office) [phone] (fax)

## 2020-05-22 ENCOUNTER — Telehealth: Payer: Self-pay | Admitting: Cardiology

## 2020-05-22 NOTE — Telephone Encounter (Signed)
Pt advised to monitor and call office if he experiences elevated HRs (he has not had issue, but checking in case he does). Patient verbalized understanding and agreeable to plan.

## 2020-05-22 NOTE — Telephone Encounter (Signed)
° °  Pt said he was tested positive for Covid yesterday. He wanted to check in with Dr. Elberta Fortis if it will affect his SVT.

## 2020-05-31 DIAGNOSIS — Z961 Presence of intraocular lens: Secondary | ICD-10-CM | POA: Diagnosis not present

## 2020-06-19 ENCOUNTER — Other Ambulatory Visit: Payer: Self-pay

## 2020-06-19 ENCOUNTER — Ambulatory Visit
Admission: RE | Admit: 2020-06-19 | Discharge: 2020-06-19 | Disposition: A | Discharge status: Home / Self Care | Payer: BC Managed Care – PPO | Source: Ambulatory Visit | Attending: Podiatry | Admitting: Podiatry

## 2020-06-19 DIAGNOSIS — Q666 Other congenital valgus deformities of feet: Secondary | ICD-10-CM | POA: Diagnosis not present

## 2020-06-19 DIAGNOSIS — R600 Localized edema: Secondary | ICD-10-CM | POA: Diagnosis not present

## 2020-06-19 DIAGNOSIS — M2142 Flat foot [pes planus] (acquired), left foot: Secondary | ICD-10-CM

## 2020-06-19 DIAGNOSIS — M2141 Flat foot [pes planus] (acquired), right foot: Secondary | ICD-10-CM

## 2020-06-19 DIAGNOSIS — Q6689 Other  specified congenital deformities of feet: Secondary | ICD-10-CM

## 2020-06-19 DIAGNOSIS — M79672 Pain in left foot: Secondary | ICD-10-CM | POA: Diagnosis not present

## 2020-06-27 ENCOUNTER — Ambulatory Visit (INDEPENDENT_AMBULATORY_CARE_PROVIDER_SITE_OTHER): Payer: BC Managed Care – PPO | Admitting: Podiatry

## 2020-06-27 ENCOUNTER — Other Ambulatory Visit: Payer: Self-pay

## 2020-06-27 DIAGNOSIS — M2142 Flat foot [pes planus] (acquired), left foot: Secondary | ICD-10-CM

## 2020-06-27 DIAGNOSIS — M2141 Flat foot [pes planus] (acquired), right foot: Secondary | ICD-10-CM

## 2020-06-27 DIAGNOSIS — M79671 Pain in right foot: Secondary | ICD-10-CM | POA: Diagnosis not present

## 2020-06-27 DIAGNOSIS — M659 Synovitis and tenosynovitis, unspecified: Secondary | ICD-10-CM | POA: Diagnosis not present

## 2020-06-27 DIAGNOSIS — M79672 Pain in left foot: Secondary | ICD-10-CM | POA: Diagnosis not present

## 2020-07-04 NOTE — Progress Notes (Addendum)
   Subjective:  30 y.o. male presenting today for follow-up evaluation and treatment regarding flatfeet with bilateral ankle pain.  Patient works on his feet 8-hour shifts all day.  Patient continues to have pain and tenderness to his bilateral feet despite multiple conservative modalities treatments.  He presents for further treatment and evaluation  Past Medical History:  Diagnosis Date   Headache      Objective/Physical Exam General: The patient is alert and oriented x3 in no acute distress.  Dermatology: Skin is warm, dry and supple bilateral lower extremities. Negative for open lesions or macerations.  Vascular: Palpable pedal pulses bilaterally. No edema or erythema noted. Capillary refill within normal limits.  Neurological: Epicritic and protective threshold grossly intact bilaterally.   Musculoskeletal Exam: Range of motion within normal limits to all pedal and ankle joints bilateral. Muscle strength 5/5 in all groups bilateral.  Upon weightbearing there is a medial longitudinal arch collapse bilaterally. Rearfoot valgus noted to the bilateral lower extremities with excessive pronation upon mid stance. The rearfoot to the left lower extremity is very rigid.  Minimal inversion and eversion noted suggestive of possible tarsal coalition Pain with palpation noted to the medial, lateral and anterior aspects of the bilateral ankle joints.   MRI impression 06/19/2020 LT:  Marked hindfoot valgus deformity with associated marrow edema in the lateral process of the talus and adjacent calcaneus.   Negative for tarsal coalition.   Tendons and ligaments are intact.  Assessment: 1. pes planus bilateral 2. Ankle synovitis bilateral  3. Equinus left lower extremity  Plan of Care:  1. Patient was evaluated.  2.  Continue oral diclofenac 75 mg 2 times daily as needed 3.  Continue custom molded orthotics 4.  Today we discussed the conservative versus surgical management of the presenting  pathology. The patient opts for surgical management. All possible complications and details of the procedure were explained. All patient questions were answered. No guarantees were expressed or implied. 3. Authorization for surgery was initiated today. Surgery will consist of tendo Achilles lengthening left.  Hansel Devan calcaneal osteotomy left.  Cotton medial cuneiform osteotomy left. Possible Koutsogiannis medial slide calcaneal osteotomy left.  4.  Order placed for knee scooter/knee walker.  Medically necessary.  Patient will be strictly nonweightbearing postoperatively for 6-8 weeks 5.  Return to clinic 1 week postop   Works at a company that [employer] for [employer]. Mother's name is Charna Archer?    Felecia Shelling, DPM [facility]  Dr. Felecia Shelling, DPM    [address]                                        [place], [place] [address]                Office [phone]  Fax [phone]

## 2020-07-19 ENCOUNTER — Other Ambulatory Visit: Payer: Self-pay | Admitting: Cardiology

## 2020-07-23 ENCOUNTER — Other Ambulatory Visit: Payer: Self-pay | Admitting: Cardiology

## 2020-08-21 ENCOUNTER — Telehealth: Payer: Self-pay | Admitting: Urology

## 2020-08-21 NOTE — Telephone Encounter (Signed)
DOS - 09/20/20  TENDO ACHILLES LENGTH LEFT --- 09811 COTTON TARSAL OSTEOTOMY LEFT --- 91478 EVAN CALCANEAL OSTEOTOMY LEFT X'S 2 --- 29562   [employer] EFFECTIVE DATE- 07/23/19  PLAN DEDUCTIBLE - $750.00 W/ $0.00 REMAINING OUT OF POCKET -  $5,550.00 W/ $5,550.00 REMAINING COINSURANCE - 0% COPAY - $40.00   SPOKE WITH CAYLA WITH [employer] AND SHE STATED THAT FOR CPT CODES 13086, 57846 AND 28300 X'S 2 NO PRIOR AUTH IS REQUIRED.   REF # [id number]

## 2020-08-30 DIAGNOSIS — M79676 Pain in unspecified toe(s): Secondary | ICD-10-CM

## 2020-09-05 ENCOUNTER — Telehealth: Payer: Self-pay

## 2020-09-05 NOTE — Telephone Encounter (Signed)
Placed order for a knee scooter with [employer]

## 2020-09-18 ENCOUNTER — Telehealth: Payer: Self-pay | Admitting: Cardiology

## 2020-09-18 MED ORDER — METOPROLOL SUCCINATE ER 50 MG PO TB24: 50.0000 mg | ORAL_TABLET | Freq: Every day | ORAL | 3 refills | Status: DC

## 2020-09-18 NOTE — Telephone Encounter (Signed)
Patient c/o Palpitations:  High priority if patient c/o lightheadedness, shortness of breath, or chest pain  How long have you had palpitations/irregular HR/ Afib? Are you having the symptoms now? Since Saturday morning. Patient is having some  Are you currently experiencing lightheadedness, SOB or CP? No   Do you have a history of afib (atrial fibrillation) or irregular heart rhythm? Around 2020  Have you checked your BP or HR? (document readings if available): no   Are you experiencing any other symptoms? Chest tight a little.

## 2020-09-18 NOTE — Telephone Encounter (Signed)
Spoke to pt. See my chart message. Pt advised to start Toprol to see if improvement. Pt agreeable to plan.

## 2020-09-18 NOTE — Telephone Encounter (Signed)
Spoke to pt.  See my chart message sent to pt w/ instructions

## 2020-09-20 ENCOUNTER — Other Ambulatory Visit: Payer: Self-pay | Admitting: Podiatry

## 2020-09-20 ENCOUNTER — Encounter: Payer: Self-pay | Admitting: Podiatry

## 2020-09-20 DIAGNOSIS — Q6652 Congenital pes planus, left foot: Secondary | ICD-10-CM | POA: Diagnosis not present

## 2020-09-20 DIAGNOSIS — M25572 Pain in left ankle and joints of left foot: Secondary | ICD-10-CM | POA: Diagnosis not present

## 2020-09-20 DIAGNOSIS — M2142 Flat foot [pes planus] (acquired), left foot: Secondary | ICD-10-CM | POA: Diagnosis not present

## 2020-09-20 DIAGNOSIS — M216X2 Other acquired deformities of left foot: Secondary | ICD-10-CM | POA: Diagnosis not present

## 2020-09-20 MED ORDER — OXYCODONE-ACETAMINOPHEN 10-325 MG PO TABS: 1.0000 | ORAL_TABLET | ORAL | 0 refills | Status: DC | PRN

## 2020-09-20 MED ORDER — IBUPROFEN 800 MG PO TABS: 800.0000 mg | ORAL_TABLET | Freq: Three times a day (TID) | ORAL | 1 refills | Status: DC

## 2020-09-20 NOTE — Progress Notes (Signed)
PRN postop 

## 2020-09-21 ENCOUNTER — Encounter: Payer: Self-pay | Admitting: Podiatry

## 2020-09-21 ENCOUNTER — Telehealth: Payer: Self-pay | Admitting: Cardiology

## 2020-09-21 NOTE — Telephone Encounter (Signed)
Spoke with pt and clarified that he may take his prescription pain medications as prescribed for short duration. He had no additional questions.

## 2020-09-21 NOTE — Telephone Encounter (Signed)
Pt had foot surgery yesterday, he wants to know if Ibuprofen and Oxycodone is safe for him to take

## 2020-09-26 ENCOUNTER — Ambulatory Visit (INDEPENDENT_AMBULATORY_CARE_PROVIDER_SITE_OTHER): Payer: BC Managed Care – PPO | Admitting: Podiatry

## 2020-09-26 ENCOUNTER — Ambulatory Visit (INDEPENDENT_AMBULATORY_CARE_PROVIDER_SITE_OTHER): Payer: BC Managed Care – PPO

## 2020-09-26 ENCOUNTER — Other Ambulatory Visit: Payer: Self-pay

## 2020-09-26 DIAGNOSIS — M2141 Flat foot [pes planus] (acquired), right foot: Secondary | ICD-10-CM

## 2020-09-26 DIAGNOSIS — M2142 Flat foot [pes planus] (acquired), left foot: Secondary | ICD-10-CM | POA: Diagnosis not present

## 2020-09-26 DIAGNOSIS — Z9889 Other specified postprocedural states: Secondary | ICD-10-CM

## 2020-09-26 MED ORDER — OXYCODONE-ACETAMINOPHEN 10-325 MG PO TABS: 1.0000 | ORAL_TABLET | ORAL | 0 refills | Status: DC | PRN

## 2020-09-26 NOTE — Progress Notes (Signed)
   Subjective:  Patient presents today status post Daniel Golden, cotton, medial slide calcaneal osteotomy, tendo Achilles lengthening LT.. DOS: 09/20/2020.  Patient states that he is doing well.  the pain has decreased significantly over the past week.  He continues to have pain however it has somewhat stabilized over the past week.  He has kept the claimant dressings clean dry and intact and nonweightbearing in the cam boot with crutches.  Past Medical History:  Diagnosis Date   Headache       Objective/Physical Exam Neurovascular status intact.  Skin incisions appear to be well coapted with sutures and staples intact. No sign of infectious process noted. No dehiscence. No active bleeding noted. Moderate edema noted to the surgical extremity.  Radiographic Exam:  Orthopedic hardware and osteotomies sites appear to be stable with routine healing.  Osteotomy to the medial cuneiform does appear to extend into the intermediate cuneiform.  We will simply observe for now  Assessment: 1. s/p Cotton, Daniel Golden, medial slide calcaneal osteotomy, tendo Achilles lengthening LT. DOS: 09/20/2020   Plan of Care:  1. Patient was evaluated. X-rays reviewed 2.  Dressings changed today.  Keep clean dry and intact x2 weeks 3.  Continue strict nonweightbearing in the cam boot with the assistance of crutches 4.  Refill prescription for Percocet 10/325 mg 5.  Return to clinic in 2 weeks for suture and staple removal   Felecia Shelling, DPM [facility]  Dr. Felecia Shelling, DPM    [address] [place], [place] [address]                Office [phone]  Fax [phone]

## 2020-10-03 ENCOUNTER — Encounter: Payer: BC Managed Care – PPO | Admitting: Podiatry

## 2020-10-08 ENCOUNTER — Other Ambulatory Visit: Payer: Self-pay

## 2020-10-08 ENCOUNTER — Encounter: Payer: Self-pay | Admitting: Podiatry

## 2020-10-08 ENCOUNTER — Ambulatory Visit (INDEPENDENT_AMBULATORY_CARE_PROVIDER_SITE_OTHER): Payer: BC Managed Care – PPO | Admitting: Podiatry

## 2020-10-08 DIAGNOSIS — M2141 Flat foot [pes planus] (acquired), right foot: Secondary | ICD-10-CM

## 2020-10-08 DIAGNOSIS — Z9889 Other specified postprocedural states: Secondary | ICD-10-CM

## 2020-10-08 DIAGNOSIS — M2142 Flat foot [pes planus] (acquired), left foot: Secondary | ICD-10-CM

## 2020-10-08 NOTE — Progress Notes (Signed)
   Subjective:  Patient presents today status post Lianah Peed, cotton, medial slide calcaneal osteotomy, tendo Achilles lengthening LT.. DOS: 09/20/2020.  Patient states that he is doing well.  Patient states the pain has resolved almost completely.  He is doing very well and nonweightbearing and in good spirits today.  Past Medical History:  Diagnosis Date   Headache       Objective/Physical Exam Neurovascular status intact.  Skin incisions appear to be well coapted with sutures and staples intact. No sign of infectious process noted. No dehiscence. No active bleeding noted. Moderate edema noted to the surgical extremity.   Assessment: 1. s/p Cotton, Lyell Clugston, medial slide calcaneal osteotomy, tendo Achilles lengthening LT. DOS: 09/20/2020   Plan of Care:  1. Patient was evaluated. 2.  Sutures and staples removed today. 3.  Continue strict nonweightbearing in the cam boot with the assistance of crutches.  Patient understands he will be nonweightbearing for an additional 6 weeks 4.  Return to clinic end of August for follow-up x-ray, including calcaneal axial, and to possibly initiate weightbearing and physical therapy  Felecia Shelling, DPM [facility]  Dr. Felecia Shelling, DPM    [address] [place], [place] [address]                Office [phone]  Fax [phone]

## 2020-10-17 ENCOUNTER — Encounter: Payer: BC Managed Care – PPO | Admitting: Podiatry

## 2020-11-19 ENCOUNTER — Ambulatory Visit (INDEPENDENT_AMBULATORY_CARE_PROVIDER_SITE_OTHER): Payer: BC Managed Care – PPO

## 2020-11-19 ENCOUNTER — Other Ambulatory Visit: Payer: Self-pay

## 2020-11-19 ENCOUNTER — Ambulatory Visit (INDEPENDENT_AMBULATORY_CARE_PROVIDER_SITE_OTHER): Payer: BC Managed Care – PPO | Admitting: Podiatry

## 2020-11-19 DIAGNOSIS — M2141 Flat foot [pes planus] (acquired), right foot: Secondary | ICD-10-CM

## 2020-11-19 DIAGNOSIS — Z9889 Other specified postprocedural states: Secondary | ICD-10-CM | POA: Diagnosis not present

## 2020-11-19 DIAGNOSIS — M2142 Flat foot [pes planus] (acquired), left foot: Secondary | ICD-10-CM

## 2020-12-03 NOTE — Progress Notes (Signed)
   Subjective:  Patient presents today status post Daniel Golden, cotton, medial slide calcaneal osteotomy, tendo Achilles lengthening LT.. DOS: 09/20/2020.  Patient continues to do well.  He mentioned that he still has some slight pain however he has been wearing the cam boot and using crutches.  Overall improvement and no new complaints this time  Past Medical History:  Diagnosis Date   Headache       Objective/Physical Exam Neurovascular status intact.  Skin incisions appear to be well coapted and healed.  No sign of infectious process noted. No dehiscence. No active bleeding noted.  Minimal edema noted to the surgical extremity.  Radiographic exam: Osteotomy sites and bone allograft's appear stable with good routine healing.  The bone wedge allograft in the calcaneus does appear to have some dorsal displacement however it looks stable and intact.  I suspect that this will heal without any complications.  Recreation of the calcaneal inclination angle noted with improved coverage of the talar head on AP view.  Orthopedic hardware is also intact and stable.  There is good medial deviation of the calcaneal tubercle noted on calcaneal axial view.   Assessment: 1. s/p Cotton, Yousif Edelson, medial slide calcaneal osteotomy, tendo Achilles lengthening LT. DOS: 09/20/2020   Plan of Care:  1. Patient was evaluated. 2.  Continue strict nonweightbearing in the cam boot with the assistance of crutches.  Patient understands he will be nonweightbearing for an additional 2 weeks.  After this.  The patient may begin to weight-bear in the tall cam boot.  Recommend a slow transition to full weightbearing over the following 2 weeks once he initiates weightbearing 4.  Compression ankle sleeve dispensed 5.  Return to clinic in 5 weeks  Felecia Shelling, DPM [facility]  Dr. Felecia Shelling, DPM    [address] [place], [place] [address]                Office [phone]   Fax [phone]

## 2020-12-14 ENCOUNTER — Other Ambulatory Visit: Payer: Self-pay | Admitting: Cardiology

## 2020-12-24 ENCOUNTER — Ambulatory Visit (INDEPENDENT_AMBULATORY_CARE_PROVIDER_SITE_OTHER): Payer: BC Managed Care – PPO | Admitting: Podiatry

## 2020-12-24 ENCOUNTER — Other Ambulatory Visit: Payer: Self-pay

## 2020-12-24 ENCOUNTER — Ambulatory Visit (INDEPENDENT_AMBULATORY_CARE_PROVIDER_SITE_OTHER): Payer: BC Managed Care – PPO

## 2020-12-24 DIAGNOSIS — Z9889 Other specified postprocedural states: Secondary | ICD-10-CM | POA: Diagnosis not present

## 2020-12-24 DIAGNOSIS — M2142 Flat foot [pes planus] (acquired), left foot: Secondary | ICD-10-CM | POA: Diagnosis not present

## 2020-12-24 DIAGNOSIS — M2141 Flat foot [pes planus] (acquired), right foot: Secondary | ICD-10-CM | POA: Diagnosis not present

## 2020-12-26 NOTE — Progress Notes (Signed)
   Subjective:  Patient presents today status post Daniel Golden, cotton, medial slide calcaneal osteotomy, tendo Achilles lengthening LT.. DOS: 09/20/2020.  Overall the patient states that he is doing very well.  He has been nonweightbearing in the cam boot.  No new complaints at this time  Past Medical History:  Diagnosis Date   Headache       Objective/Physical Exam Neurovascular status intact.  Skin incisions appear to be well coapted and healed.  No sign of infectious process noted. No dehiscence. No active bleeding noted.  Moderate edema noted to the surgical extremity.  Radiographic exam: Osteotomy sites and bone allograft's appear stable with good routine healing.  The bone wedge allograft in the calcaneus does appear to have some dorsal displacement however it looks stable and intact.  Mostly unchanged since last x-rays taken I suspect that this will heal without any complications.  Recreation of the calcaneal inclination angle noted with improved coverage of the talar head on AP view.  Orthopedic hardware is also intact and stable.  There is good medial deviation of the calcaneal tubercle noted on calcaneal axial view.  X-rays mostly unchanged since last visits.  Significant improvement from preoperative to postoperative x-ray comparison   Assessment: 1. s/p Cotton, Daniel Golden, medial slide calcaneal osteotomy, tendo Achilles lengthening LT. DOS: 09/20/2020   Plan of Care:  1. Patient was evaluated. 2.  Patient is now over 3 months postop.  He may begin weightbearing in the cam boot for the next 4 weeks. 3.  Compression ankle sleeve dispensed for the edema 4.  Order placed for physical therapy at [facility] 5.  Return to clinic in 1 month for follow-up x-ray and to possibly transition the patient out of the cam boot into sneakers  *Wants to return to work January 2023  Felecia Shelling, DPM [facility]  Dr. Felecia Shelling, DPM    [address] [place], [place] [address]                Office [phone]  Fax [phone]

## 2020-12-31 ENCOUNTER — Telehealth: Payer: Self-pay | Admitting: Podiatry

## 2020-12-31 DIAGNOSIS — M25572 Pain in left ankle and joints of left foot: Secondary | ICD-10-CM | POA: Diagnosis not present

## 2020-12-31 DIAGNOSIS — M62572 Muscle wasting and atrophy, not elsewhere classified, left ankle and foot: Secondary | ICD-10-CM | POA: Diagnosis not present

## 2020-12-31 DIAGNOSIS — M6700 Short Achilles tendon (acquired), unspecified ankle: Secondary | ICD-10-CM | POA: Diagnosis not present

## 2020-12-31 DIAGNOSIS — M25672 Stiffness of left ankle, not elsewhere classified: Secondary | ICD-10-CM | POA: Diagnosis not present

## 2020-12-31 NOTE — Telephone Encounter (Signed)
[facility] is calling about patients referral, they need it to be sent as soon as possible.    Phone: [phone] Fax:[phone]   

## 2021-01-02 ENCOUNTER — Telehealth: Payer: Self-pay | Admitting: Podiatry

## 2021-01-02 NOTE — Telephone Encounter (Signed)
[facility] is calling about patients referral, they need it to be sent as soon as possible.    Phone: [phone] Fax:[phone]

## 2021-01-03 DIAGNOSIS — M25672 Stiffness of left ankle, not elsewhere classified: Secondary | ICD-10-CM | POA: Diagnosis not present

## 2021-01-03 DIAGNOSIS — M6700 Short Achilles tendon (acquired), unspecified ankle: Secondary | ICD-10-CM | POA: Diagnosis not present

## 2021-01-03 DIAGNOSIS — M25572 Pain in left ankle and joints of left foot: Secondary | ICD-10-CM | POA: Diagnosis not present

## 2021-01-03 DIAGNOSIS — M62572 Muscle wasting and atrophy, not elsewhere classified, left ankle and foot: Secondary | ICD-10-CM | POA: Diagnosis not present

## 2021-01-07 ENCOUNTER — Telehealth: Payer: Self-pay | Admitting: Podiatry

## 2021-01-07 DIAGNOSIS — M25572 Pain in left ankle and joints of left foot: Secondary | ICD-10-CM | POA: Diagnosis not present

## 2021-01-07 DIAGNOSIS — M62572 Muscle wasting and atrophy, not elsewhere classified, left ankle and foot: Secondary | ICD-10-CM | POA: Diagnosis not present

## 2021-01-07 DIAGNOSIS — M25672 Stiffness of left ankle, not elsewhere classified: Secondary | ICD-10-CM | POA: Diagnosis not present

## 2021-01-07 DIAGNOSIS — M6700 Short Achilles tendon (acquired), unspecified ankle: Secondary | ICD-10-CM | POA: Diagnosis not present

## 2021-01-07 NOTE — Telephone Encounter (Signed)
[facility] called to request referral to be sent.  Thanks

## 2021-01-08 NOTE — Telephone Encounter (Signed)
Renea Ee could you please follow-up with this patient's referral.  We may need to re-fill out a [facility] form.  Thanks, Dr. Logan Bores

## 2021-01-09 DIAGNOSIS — M25572 Pain in left ankle and joints of left foot: Secondary | ICD-10-CM | POA: Diagnosis not present

## 2021-01-09 DIAGNOSIS — M25672 Stiffness of left ankle, not elsewhere classified: Secondary | ICD-10-CM | POA: Diagnosis not present

## 2021-01-09 DIAGNOSIS — M62572 Muscle wasting and atrophy, not elsewhere classified, left ankle and foot: Secondary | ICD-10-CM | POA: Diagnosis not present

## 2021-01-09 DIAGNOSIS — M6700 Short Achilles tendon (acquired), unspecified ankle: Secondary | ICD-10-CM | POA: Diagnosis not present

## 2021-01-15 DIAGNOSIS — M62572 Muscle wasting and atrophy, not elsewhere classified, left ankle and foot: Secondary | ICD-10-CM | POA: Diagnosis not present

## 2021-01-15 DIAGNOSIS — M25672 Stiffness of left ankle, not elsewhere classified: Secondary | ICD-10-CM | POA: Diagnosis not present

## 2021-01-15 DIAGNOSIS — M6700 Short Achilles tendon (acquired), unspecified ankle: Secondary | ICD-10-CM | POA: Diagnosis not present

## 2021-01-15 DIAGNOSIS — M25572 Pain in left ankle and joints of left foot: Secondary | ICD-10-CM | POA: Diagnosis not present

## 2021-01-17 DIAGNOSIS — M6700 Short Achilles tendon (acquired), unspecified ankle: Secondary | ICD-10-CM | POA: Diagnosis not present

## 2021-01-17 DIAGNOSIS — M62572 Muscle wasting and atrophy, not elsewhere classified, left ankle and foot: Secondary | ICD-10-CM | POA: Diagnosis not present

## 2021-01-17 DIAGNOSIS — M25672 Stiffness of left ankle, not elsewhere classified: Secondary | ICD-10-CM | POA: Diagnosis not present

## 2021-01-17 DIAGNOSIS — M25572 Pain in left ankle and joints of left foot: Secondary | ICD-10-CM | POA: Diagnosis not present

## 2021-01-22 DIAGNOSIS — M25572 Pain in left ankle and joints of left foot: Secondary | ICD-10-CM | POA: Diagnosis not present

## 2021-01-22 DIAGNOSIS — M6700 Short Achilles tendon (acquired), unspecified ankle: Secondary | ICD-10-CM | POA: Diagnosis not present

## 2021-01-22 DIAGNOSIS — M62572 Muscle wasting and atrophy, not elsewhere classified, left ankle and foot: Secondary | ICD-10-CM | POA: Diagnosis not present

## 2021-01-22 DIAGNOSIS — M25672 Stiffness of left ankle, not elsewhere classified: Secondary | ICD-10-CM | POA: Diagnosis not present

## 2021-01-24 DIAGNOSIS — M25572 Pain in left ankle and joints of left foot: Secondary | ICD-10-CM | POA: Diagnosis not present

## 2021-01-24 DIAGNOSIS — M62572 Muscle wasting and atrophy, not elsewhere classified, left ankle and foot: Secondary | ICD-10-CM | POA: Diagnosis not present

## 2021-01-24 DIAGNOSIS — M25672 Stiffness of left ankle, not elsewhere classified: Secondary | ICD-10-CM | POA: Diagnosis not present

## 2021-01-24 DIAGNOSIS — M6700 Short Achilles tendon (acquired), unspecified ankle: Secondary | ICD-10-CM | POA: Diagnosis not present

## 2021-01-28 ENCOUNTER — Other Ambulatory Visit: Payer: Self-pay

## 2021-01-28 ENCOUNTER — Ambulatory Visit (INDEPENDENT_AMBULATORY_CARE_PROVIDER_SITE_OTHER): Payer: BC Managed Care – PPO | Admitting: Podiatry

## 2021-01-28 ENCOUNTER — Ambulatory Visit (INDEPENDENT_AMBULATORY_CARE_PROVIDER_SITE_OTHER): Payer: BC Managed Care – PPO

## 2021-01-28 DIAGNOSIS — M2141 Flat foot [pes planus] (acquired), right foot: Secondary | ICD-10-CM

## 2021-01-28 DIAGNOSIS — M2142 Flat foot [pes planus] (acquired), left foot: Secondary | ICD-10-CM | POA: Diagnosis not present

## 2021-01-28 DIAGNOSIS — Z9889 Other specified postprocedural states: Secondary | ICD-10-CM

## 2021-01-28 NOTE — Progress Notes (Signed)
   Subjective:  Patient presents today status post Emila Steinhauser, cotton, medial slide calcaneal osteotomy, tendo Achilles lengthening LT.. DOS: 09/20/2020.  Overall the patient states that he is doing very well.  He has been weightbearing in the cam boot for the past few weeks.  He says that physical therapy is helping significantly  Past Medical History:  Diagnosis Date   Headache       Objective/Physical Exam Neurovascular status intact.  Skin incisions appear to be well coapted and healed.  No sign of infectious process noted. No dehiscence. No active bleeding noted.  Minimal edema noted to the surgical extremity.  Radiographic exam 01/28/2021 LT foot: Mostly unchanged since last visit osteotomy sites and bone allograft's appear stable with good routine healing.  The bone wedge allograft in the calcaneus does appear to have some dorsal displacement however it looks stable and intact.  Mostly unchanged since last x-rays taken I suspect that this will heal without any complications.  Recreation of the calcaneal inclination angle noted with improved coverage of the talar head on AP view.  Orthopedic hardware is also intact and stable.  There is good medial deviation of the calcaneal tubercle noted on calcaneal axial view.  X-rays mostly unchanged since last visits.  Significant improvement from preoperative to postoperative x-ray comparison   Assessment: 1. s/p Cotton, Zenith Lamphier, medial slide calcaneal osteotomy, tendo Achilles lengthening LT. DOS: 09/20/2020   Plan of Care:  1. Patient was evaluated. 2.  Discontinue cam boot.  Recommend good supportive shoes and sneakers 3.  Continue compression ankle sleeve 4.  Continue physical therapy.  Patient says the physical therapy is helping significantly 5.  Return to clinic in 2 months for final follow-up x-ray and to discuss return to work  *Wants to return to work January 2023  Felecia Shelling, DPM [facility]  Dr. Felecia Shelling, DPM     [address] [place], [place] [address]                Office [phone]  Fax [phone]

## 2021-01-29 DIAGNOSIS — M25572 Pain in left ankle and joints of left foot: Secondary | ICD-10-CM | POA: Diagnosis not present

## 2021-01-29 DIAGNOSIS — M6700 Short Achilles tendon (acquired), unspecified ankle: Secondary | ICD-10-CM | POA: Diagnosis not present

## 2021-01-29 DIAGNOSIS — M25672 Stiffness of left ankle, not elsewhere classified: Secondary | ICD-10-CM | POA: Diagnosis not present

## 2021-01-29 DIAGNOSIS — M62572 Muscle wasting and atrophy, not elsewhere classified, left ankle and foot: Secondary | ICD-10-CM | POA: Diagnosis not present

## 2021-01-31 DIAGNOSIS — M62572 Muscle wasting and atrophy, not elsewhere classified, left ankle and foot: Secondary | ICD-10-CM | POA: Diagnosis not present

## 2021-01-31 DIAGNOSIS — M6700 Short Achilles tendon (acquired), unspecified ankle: Secondary | ICD-10-CM | POA: Diagnosis not present

## 2021-01-31 DIAGNOSIS — M25672 Stiffness of left ankle, not elsewhere classified: Secondary | ICD-10-CM | POA: Diagnosis not present

## 2021-01-31 DIAGNOSIS — M25572 Pain in left ankle and joints of left foot: Secondary | ICD-10-CM | POA: Diagnosis not present

## 2021-02-05 DIAGNOSIS — M6700 Short Achilles tendon (acquired), unspecified ankle: Secondary | ICD-10-CM | POA: Diagnosis not present

## 2021-02-05 DIAGNOSIS — M25672 Stiffness of left ankle, not elsewhere classified: Secondary | ICD-10-CM | POA: Diagnosis not present

## 2021-02-05 DIAGNOSIS — M62572 Muscle wasting and atrophy, not elsewhere classified, left ankle and foot: Secondary | ICD-10-CM | POA: Diagnosis not present

## 2021-02-05 DIAGNOSIS — M25572 Pain in left ankle and joints of left foot: Secondary | ICD-10-CM | POA: Diagnosis not present

## 2021-02-07 DIAGNOSIS — M25572 Pain in left ankle and joints of left foot: Secondary | ICD-10-CM | POA: Diagnosis not present

## 2021-02-07 DIAGNOSIS — M62572 Muscle wasting and atrophy, not elsewhere classified, left ankle and foot: Secondary | ICD-10-CM | POA: Diagnosis not present

## 2021-02-07 DIAGNOSIS — M6700 Short Achilles tendon (acquired), unspecified ankle: Secondary | ICD-10-CM | POA: Diagnosis not present

## 2021-02-07 DIAGNOSIS — M25672 Stiffness of left ankle, not elsewhere classified: Secondary | ICD-10-CM | POA: Diagnosis not present

## 2021-02-10 DIAGNOSIS — M25672 Stiffness of left ankle, not elsewhere classified: Secondary | ICD-10-CM | POA: Diagnosis not present

## 2021-02-10 DIAGNOSIS — M6700 Short Achilles tendon (acquired), unspecified ankle: Secondary | ICD-10-CM | POA: Diagnosis not present

## 2021-02-10 DIAGNOSIS — M25572 Pain in left ankle and joints of left foot: Secondary | ICD-10-CM | POA: Diagnosis not present

## 2021-02-10 DIAGNOSIS — M62572 Muscle wasting and atrophy, not elsewhere classified, left ankle and foot: Secondary | ICD-10-CM | POA: Diagnosis not present

## 2021-02-13 DIAGNOSIS — M62572 Muscle wasting and atrophy, not elsewhere classified, left ankle and foot: Secondary | ICD-10-CM | POA: Diagnosis not present

## 2021-02-13 DIAGNOSIS — M6700 Short Achilles tendon (acquired), unspecified ankle: Secondary | ICD-10-CM | POA: Diagnosis not present

## 2021-02-13 DIAGNOSIS — M25572 Pain in left ankle and joints of left foot: Secondary | ICD-10-CM | POA: Diagnosis not present

## 2021-02-13 DIAGNOSIS — M25672 Stiffness of left ankle, not elsewhere classified: Secondary | ICD-10-CM | POA: Diagnosis not present

## 2021-02-21 DIAGNOSIS — M25572 Pain in left ankle and joints of left foot: Secondary | ICD-10-CM | POA: Diagnosis not present

## 2021-02-21 DIAGNOSIS — M62572 Muscle wasting and atrophy, not elsewhere classified, left ankle and foot: Secondary | ICD-10-CM | POA: Diagnosis not present

## 2021-02-21 DIAGNOSIS — M6700 Short Achilles tendon (acquired), unspecified ankle: Secondary | ICD-10-CM | POA: Diagnosis not present

## 2021-02-21 DIAGNOSIS — M25672 Stiffness of left ankle, not elsewhere classified: Secondary | ICD-10-CM | POA: Diagnosis not present

## 2021-02-22 DIAGNOSIS — M62572 Muscle wasting and atrophy, not elsewhere classified, left ankle and foot: Secondary | ICD-10-CM | POA: Diagnosis not present

## 2021-02-22 DIAGNOSIS — M25572 Pain in left ankle and joints of left foot: Secondary | ICD-10-CM | POA: Diagnosis not present

## 2021-02-22 DIAGNOSIS — M25672 Stiffness of left ankle, not elsewhere classified: Secondary | ICD-10-CM | POA: Diagnosis not present

## 2021-02-22 DIAGNOSIS — M6700 Short Achilles tendon (acquired), unspecified ankle: Secondary | ICD-10-CM | POA: Diagnosis not present

## 2021-02-26 DIAGNOSIS — M6700 Short Achilles tendon (acquired), unspecified ankle: Secondary | ICD-10-CM | POA: Diagnosis not present

## 2021-02-26 DIAGNOSIS — M25672 Stiffness of left ankle, not elsewhere classified: Secondary | ICD-10-CM | POA: Diagnosis not present

## 2021-02-26 DIAGNOSIS — M25572 Pain in left ankle and joints of left foot: Secondary | ICD-10-CM | POA: Diagnosis not present

## 2021-02-26 DIAGNOSIS — M62572 Muscle wasting and atrophy, not elsewhere classified, left ankle and foot: Secondary | ICD-10-CM | POA: Diagnosis not present

## 2021-02-28 DIAGNOSIS — M6700 Short Achilles tendon (acquired), unspecified ankle: Secondary | ICD-10-CM | POA: Diagnosis not present

## 2021-02-28 DIAGNOSIS — M25672 Stiffness of left ankle, not elsewhere classified: Secondary | ICD-10-CM | POA: Diagnosis not present

## 2021-02-28 DIAGNOSIS — M62572 Muscle wasting and atrophy, not elsewhere classified, left ankle and foot: Secondary | ICD-10-CM | POA: Diagnosis not present

## 2021-02-28 DIAGNOSIS — M25572 Pain in left ankle and joints of left foot: Secondary | ICD-10-CM | POA: Diagnosis not present

## 2021-03-05 DIAGNOSIS — M62572 Muscle wasting and atrophy, not elsewhere classified, left ankle and foot: Secondary | ICD-10-CM | POA: Diagnosis not present

## 2021-03-05 DIAGNOSIS — M6700 Short Achilles tendon (acquired), unspecified ankle: Secondary | ICD-10-CM | POA: Diagnosis not present

## 2021-03-05 DIAGNOSIS — M25672 Stiffness of left ankle, not elsewhere classified: Secondary | ICD-10-CM | POA: Diagnosis not present

## 2021-03-05 DIAGNOSIS — M25572 Pain in left ankle and joints of left foot: Secondary | ICD-10-CM | POA: Diagnosis not present

## 2021-03-07 DIAGNOSIS — M25572 Pain in left ankle and joints of left foot: Secondary | ICD-10-CM | POA: Diagnosis not present

## 2021-03-07 DIAGNOSIS — M25672 Stiffness of left ankle, not elsewhere classified: Secondary | ICD-10-CM | POA: Diagnosis not present

## 2021-03-07 DIAGNOSIS — M62572 Muscle wasting and atrophy, not elsewhere classified, left ankle and foot: Secondary | ICD-10-CM | POA: Diagnosis not present

## 2021-03-07 DIAGNOSIS — M6700 Short Achilles tendon (acquired), unspecified ankle: Secondary | ICD-10-CM | POA: Diagnosis not present

## 2021-03-12 DIAGNOSIS — M25672 Stiffness of left ankle, not elsewhere classified: Secondary | ICD-10-CM | POA: Diagnosis not present

## 2021-03-12 DIAGNOSIS — M62572 Muscle wasting and atrophy, not elsewhere classified, left ankle and foot: Secondary | ICD-10-CM | POA: Diagnosis not present

## 2021-03-12 DIAGNOSIS — M25572 Pain in left ankle and joints of left foot: Secondary | ICD-10-CM | POA: Diagnosis not present

## 2021-03-12 DIAGNOSIS — M6700 Short Achilles tendon (acquired), unspecified ankle: Secondary | ICD-10-CM | POA: Diagnosis not present

## 2021-03-14 DIAGNOSIS — M6700 Short Achilles tendon (acquired), unspecified ankle: Secondary | ICD-10-CM | POA: Diagnosis not present

## 2021-03-14 DIAGNOSIS — M25672 Stiffness of left ankle, not elsewhere classified: Secondary | ICD-10-CM | POA: Diagnosis not present

## 2021-03-14 DIAGNOSIS — M25572 Pain in left ankle and joints of left foot: Secondary | ICD-10-CM | POA: Diagnosis not present

## 2021-03-14 DIAGNOSIS — M62572 Muscle wasting and atrophy, not elsewhere classified, left ankle and foot: Secondary | ICD-10-CM | POA: Diagnosis not present

## 2021-03-19 DIAGNOSIS — M25572 Pain in left ankle and joints of left foot: Secondary | ICD-10-CM | POA: Diagnosis not present

## 2021-03-19 DIAGNOSIS — M6700 Short Achilles tendon (acquired), unspecified ankle: Secondary | ICD-10-CM | POA: Diagnosis not present

## 2021-03-19 DIAGNOSIS — M25672 Stiffness of left ankle, not elsewhere classified: Secondary | ICD-10-CM | POA: Diagnosis not present

## 2021-03-19 DIAGNOSIS — M62572 Muscle wasting and atrophy, not elsewhere classified, left ankle and foot: Secondary | ICD-10-CM | POA: Diagnosis not present

## 2021-03-21 DIAGNOSIS — M62572 Muscle wasting and atrophy, not elsewhere classified, left ankle and foot: Secondary | ICD-10-CM | POA: Diagnosis not present

## 2021-03-21 DIAGNOSIS — M25572 Pain in left ankle and joints of left foot: Secondary | ICD-10-CM | POA: Diagnosis not present

## 2021-03-21 DIAGNOSIS — M25672 Stiffness of left ankle, not elsewhere classified: Secondary | ICD-10-CM | POA: Diagnosis not present

## 2021-03-21 DIAGNOSIS — M6700 Short Achilles tendon (acquired), unspecified ankle: Secondary | ICD-10-CM | POA: Diagnosis not present

## 2021-03-26 DIAGNOSIS — M6700 Short Achilles tendon (acquired), unspecified ankle: Secondary | ICD-10-CM | POA: Diagnosis not present

## 2021-03-26 DIAGNOSIS — M62572 Muscle wasting and atrophy, not elsewhere classified, left ankle and foot: Secondary | ICD-10-CM | POA: Diagnosis not present

## 2021-03-26 DIAGNOSIS — M25672 Stiffness of left ankle, not elsewhere classified: Secondary | ICD-10-CM | POA: Diagnosis not present

## 2021-03-26 DIAGNOSIS — M25572 Pain in left ankle and joints of left foot: Secondary | ICD-10-CM | POA: Diagnosis not present

## 2021-03-28 DIAGNOSIS — M62572 Muscle wasting and atrophy, not elsewhere classified, left ankle and foot: Secondary | ICD-10-CM | POA: Diagnosis not present

## 2021-03-28 DIAGNOSIS — M25572 Pain in left ankle and joints of left foot: Secondary | ICD-10-CM | POA: Diagnosis not present

## 2021-03-28 DIAGNOSIS — M6700 Short Achilles tendon (acquired), unspecified ankle: Secondary | ICD-10-CM | POA: Diagnosis not present

## 2021-03-28 DIAGNOSIS — M25672 Stiffness of left ankle, not elsewhere classified: Secondary | ICD-10-CM | POA: Diagnosis not present

## 2021-04-01 ENCOUNTER — Other Ambulatory Visit: Payer: Self-pay

## 2021-04-01 ENCOUNTER — Encounter: Payer: BC Managed Care – PPO | Admitting: Podiatry

## 2021-04-01 ENCOUNTER — Ambulatory Visit: Payer: BC Managed Care – PPO

## 2021-04-03 ENCOUNTER — Encounter: Payer: Self-pay | Admitting: Podiatry

## 2021-04-03 DIAGNOSIS — M79676 Pain in unspecified toe(s): Secondary | ICD-10-CM

## 2021-04-03 NOTE — Telephone Encounter (Signed)
Please advise 

## 2021-04-05 ENCOUNTER — Ambulatory Visit (INDEPENDENT_AMBULATORY_CARE_PROVIDER_SITE_OTHER): Payer: Self-pay | Admitting: Podiatry

## 2021-04-05 ENCOUNTER — Encounter: Payer: Self-pay | Admitting: Podiatry

## 2021-04-05 ENCOUNTER — Ambulatory Visit (INDEPENDENT_AMBULATORY_CARE_PROVIDER_SITE_OTHER): Payer: BC Managed Care – PPO

## 2021-04-05 ENCOUNTER — Other Ambulatory Visit: Payer: Self-pay

## 2021-04-05 DIAGNOSIS — Z9889 Other specified postprocedural states: Secondary | ICD-10-CM | POA: Diagnosis not present

## 2021-04-05 DIAGNOSIS — Q6652 Congenital pes planus, left foot: Secondary | ICD-10-CM

## 2021-04-05 DIAGNOSIS — M62572 Muscle wasting and atrophy, not elsewhere classified, left ankle and foot: Secondary | ICD-10-CM | POA: Diagnosis not present

## 2021-04-05 DIAGNOSIS — M25572 Pain in left ankle and joints of left foot: Secondary | ICD-10-CM | POA: Diagnosis not present

## 2021-04-05 DIAGNOSIS — M25672 Stiffness of left ankle, not elsewhere classified: Secondary | ICD-10-CM | POA: Diagnosis not present

## 2021-04-05 DIAGNOSIS — M6700 Short Achilles tendon (acquired), unspecified ankle: Secondary | ICD-10-CM | POA: Diagnosis not present

## 2021-04-05 NOTE — Progress Notes (Signed)
° °  Subjective:  Patient presents today status post Rice Walsh, cotton, medial slide calcaneal osteotomy, tendo Achilles lengthening LT.. DOS: 09/20/2020.  Patient states that he is doing very well.  He presents with his mother.  Patient states that he would like to return to work.  No new complaints at this time  Past Medical History:  Diagnosis Date   Headache      Past Surgical History:  Procedure Laterality Date   EYE SURGERY     HERNIA REPAIR     LEFT HEART CATH AND CORONARY ANGIOGRAPHY N/A 10/29/2018   Procedure: LEFT HEART CATH AND CORONARY ANGIOGRAPHY;  Surgeon: Yvonne Kendall, MD;  [facility]: [facility];  Service: Cardiovascular;  Laterality: N/A;   SVT ABLATION N/A 12/24/2018   Procedure: SVT ABLATION;  Surgeon: Regan Lemming, MD;  Location: [facility];  Service: Cardiovascular;  Laterality: N/A;   No Known Allergies   Objective/Physical Exam Neurovascular status intact.  Skin incisions appear to be well coapted and healed.  No sign of infectious process noted. No dehiscence. No active bleeding noted.  Minimal edema noted to the surgical extremity.  With weightbearing the foot is in a good rectus alignment and maintaining the medial longitudinal arch of the foot.  Significantly improved compared to before surgery  Radiographic exam LT foot: Orthopedic hardware intact.  Mostly unchanged since last visit.  There is some diffuse osteoporosis noted.  Otherwise well-healing surgical foot  Assessment: 1. s/p Cotton, Reyan Helle, medial slide calcaneal osteotomy, tendo Achilles lengthening LT. DOS: 09/20/2020   Plan of Care:  1. Patient was evaluated.  X-rays reviewed 2.  Patient has been doing very well in tennis shoes.  He is ready to return to work.  Patient may now resume full activity no restrictions. 3.  I did recommend that the patient slowly increase activity at work.  He works on his feet all day long 8-hour shifts.  It would be recommended to do half shifts only  x2 weeks to get back into the routine if work allows.  Patient states that work will not allow him to do half shifts or modified activities.  He needs to return to work full activity with no restrictions.  Note was provided 4.  Note was also provided to allow regular supportive sneakers and not wear steel toed boots 5.  Return to clinic as needed   Felecia Shelling, DPM [facility]  Dr. Felecia Shelling, DPM    [address] [place], [place] [address]                Office [phone]  Fax [phone]

## 2021-04-10 DIAGNOSIS — M25672 Stiffness of left ankle, not elsewhere classified: Secondary | ICD-10-CM | POA: Diagnosis not present

## 2021-04-10 DIAGNOSIS — M25572 Pain in left ankle and joints of left foot: Secondary | ICD-10-CM | POA: Diagnosis not present

## 2021-04-10 DIAGNOSIS — M6700 Short Achilles tendon (acquired), unspecified ankle: Secondary | ICD-10-CM | POA: Diagnosis not present

## 2021-04-10 DIAGNOSIS — M62572 Muscle wasting and atrophy, not elsewhere classified, left ankle and foot: Secondary | ICD-10-CM | POA: Diagnosis not present

## 2021-04-10 NOTE — Progress Notes (Signed)
This encounter was created in error - please disregard.

## 2021-04-13 ENCOUNTER — Other Ambulatory Visit: Payer: Self-pay | Admitting: Podiatry

## 2021-04-15 ENCOUNTER — Encounter: Payer: BC Managed Care – PPO | Admitting: Podiatry

## 2021-04-22 DIAGNOSIS — M25572 Pain in left ankle and joints of left foot: Secondary | ICD-10-CM | POA: Diagnosis not present

## 2021-04-22 DIAGNOSIS — M25672 Stiffness of left ankle, not elsewhere classified: Secondary | ICD-10-CM | POA: Diagnosis not present

## 2021-04-22 DIAGNOSIS — M6700 Short Achilles tendon (acquired), unspecified ankle: Secondary | ICD-10-CM | POA: Diagnosis not present

## 2021-04-22 DIAGNOSIS — M62572 Muscle wasting and atrophy, not elsewhere classified, left ankle and foot: Secondary | ICD-10-CM | POA: Diagnosis not present

## 2021-04-24 DIAGNOSIS — M25672 Stiffness of left ankle, not elsewhere classified: Secondary | ICD-10-CM | POA: Diagnosis not present

## 2021-04-24 DIAGNOSIS — M62572 Muscle wasting and atrophy, not elsewhere classified, left ankle and foot: Secondary | ICD-10-CM | POA: Diagnosis not present

## 2021-04-24 DIAGNOSIS — M6700 Short Achilles tendon (acquired), unspecified ankle: Secondary | ICD-10-CM | POA: Diagnosis not present

## 2021-04-24 DIAGNOSIS — M25572 Pain in left ankle and joints of left foot: Secondary | ICD-10-CM | POA: Diagnosis not present

## 2021-04-29 DIAGNOSIS — M62572 Muscle wasting and atrophy, not elsewhere classified, left ankle and foot: Secondary | ICD-10-CM | POA: Diagnosis not present

## 2021-04-29 DIAGNOSIS — M25572 Pain in left ankle and joints of left foot: Secondary | ICD-10-CM | POA: Diagnosis not present

## 2021-04-29 DIAGNOSIS — M6700 Short Achilles tendon (acquired), unspecified ankle: Secondary | ICD-10-CM | POA: Diagnosis not present

## 2021-04-29 DIAGNOSIS — M25672 Stiffness of left ankle, not elsewhere classified: Secondary | ICD-10-CM | POA: Diagnosis not present

## 2021-05-01 DIAGNOSIS — M25572 Pain in left ankle and joints of left foot: Secondary | ICD-10-CM | POA: Diagnosis not present

## 2021-05-01 DIAGNOSIS — M6700 Short Achilles tendon (acquired), unspecified ankle: Secondary | ICD-10-CM | POA: Diagnosis not present

## 2021-05-01 DIAGNOSIS — M62572 Muscle wasting and atrophy, not elsewhere classified, left ankle and foot: Secondary | ICD-10-CM | POA: Diagnosis not present

## 2021-05-01 DIAGNOSIS — M25672 Stiffness of left ankle, not elsewhere classified: Secondary | ICD-10-CM | POA: Diagnosis not present

## 2021-05-06 DIAGNOSIS — M25672 Stiffness of left ankle, not elsewhere classified: Secondary | ICD-10-CM | POA: Diagnosis not present

## 2021-05-06 DIAGNOSIS — M62572 Muscle wasting and atrophy, not elsewhere classified, left ankle and foot: Secondary | ICD-10-CM | POA: Diagnosis not present

## 2021-05-06 DIAGNOSIS — M6700 Short Achilles tendon (acquired), unspecified ankle: Secondary | ICD-10-CM | POA: Diagnosis not present

## 2021-05-06 DIAGNOSIS — M25572 Pain in left ankle and joints of left foot: Secondary | ICD-10-CM | POA: Diagnosis not present

## 2021-05-08 DIAGNOSIS — M62572 Muscle wasting and atrophy, not elsewhere classified, left ankle and foot: Secondary | ICD-10-CM | POA: Diagnosis not present

## 2021-05-08 DIAGNOSIS — M25672 Stiffness of left ankle, not elsewhere classified: Secondary | ICD-10-CM | POA: Diagnosis not present

## 2021-05-08 DIAGNOSIS — M25572 Pain in left ankle and joints of left foot: Secondary | ICD-10-CM | POA: Diagnosis not present

## 2021-05-08 DIAGNOSIS — M6700 Short Achilles tendon (acquired), unspecified ankle: Secondary | ICD-10-CM | POA: Diagnosis not present

## 2021-05-13 DIAGNOSIS — M25672 Stiffness of left ankle, not elsewhere classified: Secondary | ICD-10-CM | POA: Diagnosis not present

## 2021-05-13 DIAGNOSIS — M6700 Short Achilles tendon (acquired), unspecified ankle: Secondary | ICD-10-CM | POA: Diagnosis not present

## 2021-05-13 DIAGNOSIS — M62572 Muscle wasting and atrophy, not elsewhere classified, left ankle and foot: Secondary | ICD-10-CM | POA: Diagnosis not present

## 2021-05-13 DIAGNOSIS — M25572 Pain in left ankle and joints of left foot: Secondary | ICD-10-CM | POA: Diagnosis not present

## 2021-05-20 DIAGNOSIS — M25672 Stiffness of left ankle, not elsewhere classified: Secondary | ICD-10-CM | POA: Diagnosis not present

## 2021-05-20 DIAGNOSIS — M62572 Muscle wasting and atrophy, not elsewhere classified, left ankle and foot: Secondary | ICD-10-CM | POA: Diagnosis not present

## 2021-05-20 DIAGNOSIS — M25572 Pain in left ankle and joints of left foot: Secondary | ICD-10-CM | POA: Diagnosis not present

## 2021-05-20 DIAGNOSIS — M6700 Short Achilles tendon (acquired), unspecified ankle: Secondary | ICD-10-CM | POA: Diagnosis not present

## 2021-05-22 DIAGNOSIS — M25572 Pain in left ankle and joints of left foot: Secondary | ICD-10-CM | POA: Diagnosis not present

## 2021-05-22 DIAGNOSIS — M6700 Short Achilles tendon (acquired), unspecified ankle: Secondary | ICD-10-CM | POA: Diagnosis not present

## 2021-05-22 DIAGNOSIS — M25672 Stiffness of left ankle, not elsewhere classified: Secondary | ICD-10-CM | POA: Diagnosis not present

## 2021-05-22 DIAGNOSIS — M62572 Muscle wasting and atrophy, not elsewhere classified, left ankle and foot: Secondary | ICD-10-CM | POA: Diagnosis not present

## 2021-05-27 ENCOUNTER — Encounter: Payer: Self-pay | Admitting: Emergency Medicine

## 2021-05-27 ENCOUNTER — Ambulatory Visit
Admission: EM | Admit: 2021-05-27 | Discharge: 2021-05-27 | Disposition: A | Discharge status: Home / Self Care | Payer: BC Managed Care – PPO | Attending: Internal Medicine | Admitting: Internal Medicine

## 2021-05-27 ENCOUNTER — Other Ambulatory Visit: Payer: Self-pay

## 2021-05-27 DIAGNOSIS — L02214 Cutaneous abscess of groin: Secondary | ICD-10-CM

## 2021-05-27 DIAGNOSIS — J069 Acute upper respiratory infection, unspecified: Secondary | ICD-10-CM | POA: Diagnosis not present

## 2021-05-27 LAB — POCT URINALYSIS DIP (MANUAL ENTRY)
Bilirubin, UA: NEGATIVE
Glucose, UA: NEGATIVE mg/dL
Ketones, POC UA: NEGATIVE mg/dL
Leukocytes, UA: NEGATIVE
Nitrite, UA: NEGATIVE
Protein Ur, POC: NEGATIVE mg/dL
Spec Grav, UA: >=1.03 — AB (ref 1.010–1.025)
Urobilinogen, UA: 2 E.U./dL — AB
pH, UA: 6 (ref 5.0–8.0)

## 2021-05-27 MED ORDER — FLUTICASONE PROPIONATE 50 MCG/ACT NA SUSP: 1.0000 | Freq: Every day | NASAL | 0 refills | Status: DC

## 2021-05-27 MED ORDER — BENZONATATE 100 MG PO CAPS: 100.0000 mg | ORAL_CAPSULE | Freq: Three times a day (TID) | ORAL | 0 refills | Status: DC | PRN

## 2021-05-27 MED ORDER — DOXYCYCLINE HYCLATE 100 MG PO CAPS
100.0000 mg | ORAL_CAPSULE | Freq: Two times a day (BID) | ORAL | 0 refills | Status: DC
Start: 2021-05-27 — End: 2021-07-29

## 2021-05-27 NOTE — ED Provider Notes (Signed)
[facility]    CSN: [id number] Arrival date & time: 05/27/21  1416      History   Chief Complaint Chief Complaint  Patient presents with   Nasal Congestion   Dysuria    HPI Daniel Golden is a 31 y.o. male.   Patient presents with 1 week history of nasal congestion and nonproductive cough.  Denies any known fevers or sick contacts.  Patient has taken Allegra for symptoms with minimal improvement.  Denies chest pain, shortness of breath, sore throat, ear pain, nausea, vomiting, diarrhea, abdominal pain.  Patient is also concerned for possible urinary tract infection as he is having some right groin pain.  Patient reports that he had a urinary tract infection the last time he had this similar pain.  Denies urinary burning, urinary frequency, testicular pain, penile discharge.  Denies any known exposure to STD or any concern for STD.   Dysuria  Past Medical History:  Diagnosis Date   Headache     Patient Active Problem List   Diagnosis Date Noted   Chest pain of uncertain etiology    SVT (supraventricular tachycardia) ([place])    Abnormal EKG    Palpitations 10/28/2018    Past Surgical History:  Procedure Laterality Date   EYE SURGERY     HERNIA REPAIR     LEFT HEART CATH AND CORONARY ANGIOGRAPHY N/A 10/29/2018   Procedure: LEFT HEART CATH AND CORONARY ANGIOGRAPHY;  Surgeon: Nelva Bush, MD;  Location: [facility];  Service: Cardiovascular;  Laterality: N/A;   SVT ABLATION N/A 12/24/2018   Procedure: SVT ABLATION;  Surgeon: Constance Haw, MD;  Location: [facility];  Service: Cardiovascular;  Laterality: N/A;       Home Medications    Prior to Admission medications   Medication Sig Start Date End Date Taking? Authorizing Provider  benzonatate (TESSALON) 100 MG capsule Take 1 capsule (100 mg total) by mouth every 8 (eight) hours as needed for cough. 05/27/21  Yes Alline Pio, Hildred Alamin E, FNP  doxycycline (VIBRAMYCIN) 100 MG capsule Take 1  capsule (100 mg total) by mouth 2 (two) times daily. 05/27/21  Yes Elizar Alpern, Hildred Alamin E, FNP  fluticasone (FLONASE) 50 MCG/ACT nasal spray Place 1 spray into both nostrils daily for 3 days. 05/27/21 05/30/21 Yes Deniese Oberry, Michele Rockers, FNP  acetaminophen (TYLENOL) 500 MG tablet Take 500 mg by mouth every 6 (six) hours as needed for moderate pain or headache.    [provider]  diclofenac (VOLTAREN) 75 MG EC tablet TAKE 1 TABLET BY MOUTH TWICE A DAY 04/15/21   Evans, Dorathy Daft, DPM  diltiazem (CARDIZEM) 30 MG tablet TAKE 1 TABLET (30 MG TOTAL) BY MOUTH 4 (FOUR) TIMES DAILY AS NEEDED (FOR ELEVATED HEART RATES). 07/24/20   Camnitz, Ocie Doyne, MD  ibuprofen (ADVIL) 800 MG tablet Take 1 tablet (800 mg total) by mouth 3 (three) times daily. 09/20/20   Edrick Kins, DPM  metoprolol succinate (TOPROL-XL) 50 MG 24 hr tablet TAKE 1 TABLET BY MOUTH DAILY. TAKE WITH OR IMMEDIATELY FOLLOWING A MEAL. 12/14/20   Camnitz, Ocie Doyne, MD  naproxen (NAPROSYN) 500 MG tablet Take 1 tablet (500 mg total) by mouth 2 (two) times daily. 07/30/19   Hall-Potvin, [place], PA-C  oxyCODONE-acetaminophen (PERCOCET) 10-325 MG tablet Take 1 tablet by mouth every 4 (four) hours as needed for pain. 09/26/20   Edrick Kins, DPM  famotidine (PEPCID) 20 MG tablet Take 1 tablet (20 mg total) by mouth 2 (two) times  daily. Patient taking differently: Take 20 mg by mouth 2 (two) times daily as needed for heartburn.  10/25/18 07/30/19  Okey Regal, PA-C    Family History Family History  Problem Relation Age of Onset   Diabetes Mother    Heart failure Mother    Diabetes Father    Heart failure Father     Social History Social History   Tobacco Use   Smoking status: Never   Smokeless tobacco: Never  Vaping Use   Vaping Use: Never used  Substance Use Topics   Alcohol use: Yes    Comment: OCCASIONALLY   Drug use: No     Allergies   Patient has no known allergies.   Review of Systems Review of Systems Per HPI  Physical  Exam Triage Vital Signs ED Triage Vitals  Enc Vitals Group     BP 05/27/21 1436 122/80     Pulse Rate 05/27/21 1436 74     Resp 05/27/21 1436 20     Temp 05/27/21 1436 98 F (36.7 C)     Temp Source 05/27/21 1436 Oral     SpO2 05/27/21 1436 98 %     Weight --      Height --      Head Circumference --      Peak Flow --      Pain Score 05/27/21 1443 0     Pain Loc --      Pain Edu? --      Excl. in [place]? --    No data found.  Updated Vital Signs BP 122/80 (BP Location: Right Arm)    Pulse 74    Temp 98 F (36.7 C) (Oral)    Resp 20    SpO2 98%   Visual Acuity Right Eye Distance:   Left Eye Distance:   Bilateral Distance:    Right Eye Near:   Left Eye Near:    Bilateral Near:     Physical Exam Exam conducted with a chaperone present.  Constitutional:      General: He is not in acute distress.    Appearance: Normal appearance. He is not toxic-appearing or diaphoretic.  HENT:     Head: Normocephalic and atraumatic.     Right Ear: Tympanic membrane and ear canal normal.     Left Ear: Tympanic membrane and ear canal normal.     Nose: Congestion present.     Mouth/Throat:     Mouth: Mucous membranes are moist.     Pharynx: No posterior oropharyngeal erythema.  Eyes:     Extraocular Movements: Extraocular movements intact.     Conjunctiva/sclera: Conjunctivae normal.     Pupils: Pupils are equal, round, and reactive to light.  Cardiovascular:     Rate and Rhythm: Normal rate and regular rhythm.     Pulses: Normal pulses.     Heart sounds: Normal heart sounds.  Pulmonary:     Effort: Pulmonary effort is normal. No respiratory distress.     Breath sounds: Normal breath sounds. No stridor. No wheezing, rhonchi or rales.  Abdominal:     General: Abdomen is flat. Bowel sounds are normal.     Palpations: Abdomen is soft.  Musculoskeletal:        General: Normal range of motion.     Cervical back: Normal range of motion.  Skin:    General: Skin is warm and dry.      Comments: Patient has redness and induration present to right groin.  It  does not extend to testicles.  No drainage noted.  Neurological:     General: No focal deficit present.     Mental Status: He is alert and oriented to person, place, and time. Mental status is at baseline.  Psychiatric:        Mood and Affect: Mood normal.        Behavior: Behavior normal.     UC Treatments / Results  Labs (all labs ordered are listed, but only abnormal results are displayed) Labs Reviewed  POCT URINALYSIS DIP (MANUAL ENTRY) - Abnormal; Notable for the following components:      Result Value   Spec Grav, UA >=1.030 (*)    Blood, UA trace-lysed (*)    Urobilinogen, UA 2.0 (*)    All other components within normal limits  NOVEL CORONAVIRUS, NAA    EKG   Radiology No results found.  Procedures Procedures (including critical care time)  Medications Ordered in UC Medications - No data to display  Initial Impression / Assessment and Plan / UC Course  I have reviewed the triage vital signs and the nursing notes.  Pertinent labs & imaging results that were available during my care of the patient were reviewed by me and considered in my medical decision making (see chart for details).     Patient presents with symptoms likely from a viral upper respiratory infection. Differential includes bacterial pneumonia, sinusitis, allergic rhinitis, COVID-19, flu. Do not suspect underlying cardiopulmonary process. Symptoms seem unlikely related to ACS, CHF or COPD exacerbations, pneumonia, pneumothorax. Patient is nontoxic appearing and not in need of emergent medical intervention.  COVID test pending.Recommended symptom control with over the counter medications: Daily oral anti-histamine, Oral decongestant or IN corticosteroid, saline irrigations, cepacol lozenges, Robitussin, Delsym, honey tea.  Patient sent prescriptions. Return if symptoms fail to improve in 1-2 weeks or you develop shortness of  breath, chest pain, severe headache.   Patient appears to have abscess to right groin.  Will treat with doxycycline antibiotic.  Patient also is to use warm compresses.  It does not appear that testicles are involved so do not think that patient needs further evaluation at the hospital at this time.  Discussed return and ER precautions. Patient states understanding and is agreeable. Urinalysis was completed and charted prior to healthcare provider examination of patient but there is no concern for UTI or STD given history and physical exam.   Discharged with PCP followup.  Final Clinical Impressions(s) / UC Diagnoses   Final diagnoses:  Abscess of right groin  Viral upper respiratory tract infection with cough     Discharge Instructions      It appears that you have a viral upper respiratory infection that should self resolve in the next few days with symptomatic treatment.  You have been prescribed 2 medications to alleviate this.  COVID test is pending.  We will call if it is positive.  You also have an abscess which is an infection of your skin of your right groin.  You have been prescribed antibiotic to treat this.  Please also use warm compresses to affected area.  Go the hospital if it worsens, does not improve, or if your testicles become involved.    ED Prescriptions     Medication Sig Dispense Auth. Provider   doxycycline (VIBRAMYCIN) 100 MG capsule Take 1 capsule (100 mg total) by mouth 2 (two) times daily. 20 capsule [place], [place] E, [place]   benzonatate (TESSALON) 100 MG capsule Take 1 capsule (100  mg total) by mouth every 8 (eight) hours as needed for cough. 21 capsule [place], [place] E, [place]   fluticasone [facility]) 50 MCG/ACT nasal spray Place 1 spray into both nostrils daily for 3 days. 16 g Teodora Medici, [place]      PDMP not reviewed this encounter.   Teodora Medici, [place] 05/27/21 1539

## 2021-05-27 NOTE — Discharge Instructions (Signed)
It appears that you have a viral upper respiratory infection that should self resolve in the next few days with symptomatic treatment.  You have been prescribed 2 medications to alleviate this.  COVID test is pending.  We will call if it is positive.  You also have an abscess which is an infection of your skin of your right groin.  You have been prescribed antibiotic to treat this.  Please also use warm compresses to affected area.  Go the hospital if it worsens, does not improve, or if your testicles become involved. ?

## 2021-05-27 NOTE — ED Triage Notes (Signed)
Negative at home covid test. Nasal congestion, clear/yellow, over the last week. Loss of taste/smell. Taking allergy medications without improvement. Also requesting medication for a UTI. Describes pain in right groin that was similar to the pain he had the last time he had a UTI. Denies dysuria, urinary frequency, penile drainage, itching/rash to penis.  ?

## 2021-05-28 LAB — NOVEL CORONAVIRUS, NAA: SARS-CoV-2, NAA: NOT DETECTED

## 2021-05-29 DIAGNOSIS — M25572 Pain in left ankle and joints of left foot: Secondary | ICD-10-CM | POA: Diagnosis not present

## 2021-05-29 DIAGNOSIS — M25672 Stiffness of left ankle, not elsewhere classified: Secondary | ICD-10-CM | POA: Diagnosis not present

## 2021-05-29 DIAGNOSIS — M62572 Muscle wasting and atrophy, not elsewhere classified, left ankle and foot: Secondary | ICD-10-CM | POA: Diagnosis not present

## 2021-05-29 DIAGNOSIS — M6700 Short Achilles tendon (acquired), unspecified ankle: Secondary | ICD-10-CM | POA: Diagnosis not present

## 2021-06-03 DIAGNOSIS — H26492 Other secondary cataract, left eye: Secondary | ICD-10-CM | POA: Diagnosis not present

## 2021-06-03 DIAGNOSIS — M62572 Muscle wasting and atrophy, not elsewhere classified, left ankle and foot: Secondary | ICD-10-CM | POA: Diagnosis not present

## 2021-06-03 DIAGNOSIS — M25572 Pain in left ankle and joints of left foot: Secondary | ICD-10-CM | POA: Diagnosis not present

## 2021-06-03 DIAGNOSIS — M25672 Stiffness of left ankle, not elsewhere classified: Secondary | ICD-10-CM | POA: Diagnosis not present

## 2021-06-03 DIAGNOSIS — H25811 Combined forms of age-related cataract, right eye: Secondary | ICD-10-CM | POA: Diagnosis not present

## 2021-06-03 DIAGNOSIS — M6700 Short Achilles tendon (acquired), unspecified ankle: Secondary | ICD-10-CM | POA: Diagnosis not present

## 2021-06-03 DIAGNOSIS — Z961 Presence of intraocular lens: Secondary | ICD-10-CM | POA: Diagnosis not present

## 2021-06-06 DIAGNOSIS — M62572 Muscle wasting and atrophy, not elsewhere classified, left ankle and foot: Secondary | ICD-10-CM | POA: Diagnosis not present

## 2021-06-06 DIAGNOSIS — M6700 Short Achilles tendon (acquired), unspecified ankle: Secondary | ICD-10-CM | POA: Diagnosis not present

## 2021-06-06 DIAGNOSIS — M25572 Pain in left ankle and joints of left foot: Secondary | ICD-10-CM | POA: Diagnosis not present

## 2021-06-06 DIAGNOSIS — M25672 Stiffness of left ankle, not elsewhere classified: Secondary | ICD-10-CM | POA: Diagnosis not present

## 2021-06-10 DIAGNOSIS — M25572 Pain in left ankle and joints of left foot: Secondary | ICD-10-CM | POA: Diagnosis not present

## 2021-06-10 DIAGNOSIS — M6700 Short Achilles tendon (acquired), unspecified ankle: Secondary | ICD-10-CM | POA: Diagnosis not present

## 2021-06-10 DIAGNOSIS — M25672 Stiffness of left ankle, not elsewhere classified: Secondary | ICD-10-CM | POA: Diagnosis not present

## 2021-06-10 DIAGNOSIS — M62572 Muscle wasting and atrophy, not elsewhere classified, left ankle and foot: Secondary | ICD-10-CM | POA: Diagnosis not present

## 2021-06-12 DIAGNOSIS — M25572 Pain in left ankle and joints of left foot: Secondary | ICD-10-CM | POA: Diagnosis not present

## 2021-06-12 DIAGNOSIS — M62572 Muscle wasting and atrophy, not elsewhere classified, left ankle and foot: Secondary | ICD-10-CM | POA: Diagnosis not present

## 2021-06-12 DIAGNOSIS — M25672 Stiffness of left ankle, not elsewhere classified: Secondary | ICD-10-CM | POA: Diagnosis not present

## 2021-06-12 DIAGNOSIS — M6700 Short Achilles tendon (acquired), unspecified ankle: Secondary | ICD-10-CM | POA: Diagnosis not present

## 2021-06-17 DIAGNOSIS — M25672 Stiffness of left ankle, not elsewhere classified: Secondary | ICD-10-CM | POA: Diagnosis not present

## 2021-06-17 DIAGNOSIS — M62572 Muscle wasting and atrophy, not elsewhere classified, left ankle and foot: Secondary | ICD-10-CM | POA: Diagnosis not present

## 2021-06-17 DIAGNOSIS — M6700 Short Achilles tendon (acquired), unspecified ankle: Secondary | ICD-10-CM | POA: Diagnosis not present

## 2021-06-17 DIAGNOSIS — M25572 Pain in left ankle and joints of left foot: Secondary | ICD-10-CM | POA: Diagnosis not present

## 2021-06-19 DIAGNOSIS — M6700 Short Achilles tendon (acquired), unspecified ankle: Secondary | ICD-10-CM | POA: Diagnosis not present

## 2021-06-19 DIAGNOSIS — M62572 Muscle wasting and atrophy, not elsewhere classified, left ankle and foot: Secondary | ICD-10-CM | POA: Diagnosis not present

## 2021-06-19 DIAGNOSIS — M25572 Pain in left ankle and joints of left foot: Secondary | ICD-10-CM | POA: Diagnosis not present

## 2021-06-19 DIAGNOSIS — M25672 Stiffness of left ankle, not elsewhere classified: Secondary | ICD-10-CM | POA: Diagnosis not present

## 2021-06-24 ENCOUNTER — Other Ambulatory Visit: Payer: Self-pay | Admitting: Cardiology

## 2021-06-24 DIAGNOSIS — M62572 Muscle wasting and atrophy, not elsewhere classified, left ankle and foot: Secondary | ICD-10-CM | POA: Diagnosis not present

## 2021-06-24 DIAGNOSIS — M25572 Pain in left ankle and joints of left foot: Secondary | ICD-10-CM | POA: Diagnosis not present

## 2021-06-24 DIAGNOSIS — M6700 Short Achilles tendon (acquired), unspecified ankle: Secondary | ICD-10-CM | POA: Diagnosis not present

## 2021-06-24 DIAGNOSIS — M25672 Stiffness of left ankle, not elsewhere classified: Secondary | ICD-10-CM | POA: Diagnosis not present

## 2021-07-01 DIAGNOSIS — M25572 Pain in left ankle and joints of left foot: Secondary | ICD-10-CM | POA: Diagnosis not present

## 2021-07-01 DIAGNOSIS — M25672 Stiffness of left ankle, not elsewhere classified: Secondary | ICD-10-CM | POA: Diagnosis not present

## 2021-07-01 DIAGNOSIS — M6700 Short Achilles tendon (acquired), unspecified ankle: Secondary | ICD-10-CM | POA: Diagnosis not present

## 2021-07-01 DIAGNOSIS — M62572 Muscle wasting and atrophy, not elsewhere classified, left ankle and foot: Secondary | ICD-10-CM | POA: Diagnosis not present

## 2021-07-03 DIAGNOSIS — M62572 Muscle wasting and atrophy, not elsewhere classified, left ankle and foot: Secondary | ICD-10-CM | POA: Diagnosis not present

## 2021-07-03 DIAGNOSIS — M25672 Stiffness of left ankle, not elsewhere classified: Secondary | ICD-10-CM | POA: Diagnosis not present

## 2021-07-03 DIAGNOSIS — M6700 Short Achilles tendon (acquired), unspecified ankle: Secondary | ICD-10-CM | POA: Diagnosis not present

## 2021-07-03 DIAGNOSIS — M25572 Pain in left ankle and joints of left foot: Secondary | ICD-10-CM | POA: Diagnosis not present

## 2021-07-09 DIAGNOSIS — M6700 Short Achilles tendon (acquired), unspecified ankle: Secondary | ICD-10-CM | POA: Diagnosis not present

## 2021-07-09 DIAGNOSIS — M25672 Stiffness of left ankle, not elsewhere classified: Secondary | ICD-10-CM | POA: Diagnosis not present

## 2021-07-09 DIAGNOSIS — M62572 Muscle wasting and atrophy, not elsewhere classified, left ankle and foot: Secondary | ICD-10-CM | POA: Diagnosis not present

## 2021-07-09 DIAGNOSIS — M25572 Pain in left ankle and joints of left foot: Secondary | ICD-10-CM | POA: Diagnosis not present

## 2021-07-11 DIAGNOSIS — M25672 Stiffness of left ankle, not elsewhere classified: Secondary | ICD-10-CM | POA: Diagnosis not present

## 2021-07-11 DIAGNOSIS — M6700 Short Achilles tendon (acquired), unspecified ankle: Secondary | ICD-10-CM | POA: Diagnosis not present

## 2021-07-11 DIAGNOSIS — M62572 Muscle wasting and atrophy, not elsewhere classified, left ankle and foot: Secondary | ICD-10-CM | POA: Diagnosis not present

## 2021-07-11 DIAGNOSIS — M25572 Pain in left ankle and joints of left foot: Secondary | ICD-10-CM | POA: Diagnosis not present

## 2021-07-17 DIAGNOSIS — M25572 Pain in left ankle and joints of left foot: Secondary | ICD-10-CM | POA: Diagnosis not present

## 2021-07-17 DIAGNOSIS — M62572 Muscle wasting and atrophy, not elsewhere classified, left ankle and foot: Secondary | ICD-10-CM | POA: Diagnosis not present

## 2021-07-17 DIAGNOSIS — M25672 Stiffness of left ankle, not elsewhere classified: Secondary | ICD-10-CM | POA: Diagnosis not present

## 2021-07-17 DIAGNOSIS — M6700 Short Achilles tendon (acquired), unspecified ankle: Secondary | ICD-10-CM | POA: Diagnosis not present

## 2021-07-23 ENCOUNTER — Other Ambulatory Visit: Payer: Self-pay | Admitting: Cardiology

## 2021-07-23 DIAGNOSIS — M25572 Pain in left ankle and joints of left foot: Secondary | ICD-10-CM | POA: Diagnosis not present

## 2021-07-23 DIAGNOSIS — M6700 Short Achilles tendon (acquired), unspecified ankle: Secondary | ICD-10-CM | POA: Diagnosis not present

## 2021-07-23 DIAGNOSIS — M25672 Stiffness of left ankle, not elsewhere classified: Secondary | ICD-10-CM | POA: Diagnosis not present

## 2021-07-23 DIAGNOSIS — M62572 Muscle wasting and atrophy, not elsewhere classified, left ankle and foot: Secondary | ICD-10-CM | POA: Diagnosis not present

## 2021-07-23 IMAGING — MR MR HEEL *L* W/O CM
5 series · 48 of 48 positions shown · non-contrast
Comparison: Plain films right ankle 02/10/2020.

CLINICAL DATA: Chronic right foot pain.  Question tarsal coalition.

EXAM:
MR OF THE LEFT HEEL WITHOUT CONTRAST
TECHNIQUE: Multiplanar, multisequence MR imaging of the ankle was performed. No
intravenous contrast was administered.

[Series 4: T2 fat-sat · axial · 3.0mm · 0.53mm/px · z∈[-83,+57]mm · 11 of 36 slices shown (1 of 2)]
[im 1/36]
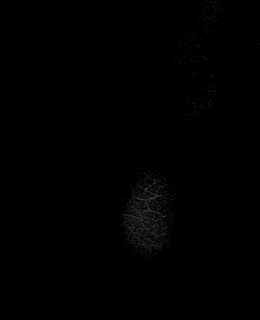
[im 4/36]
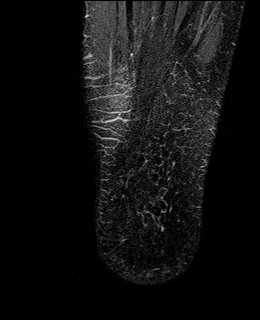
[im 8/36]
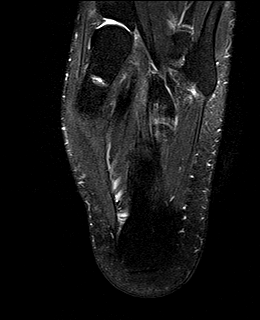
[im 11/36]
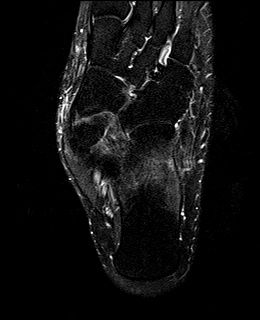
[im 15/36]
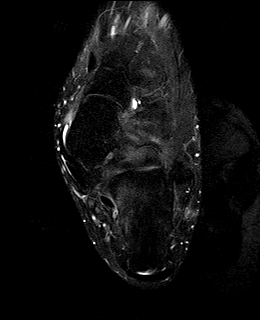
[im 18/36]
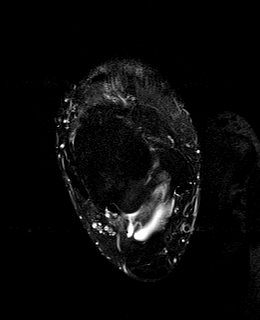
[im 22/36]
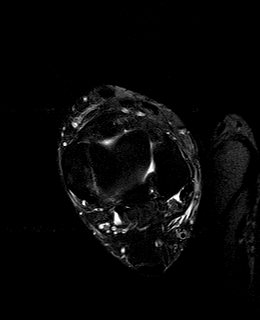
[im 25/36]
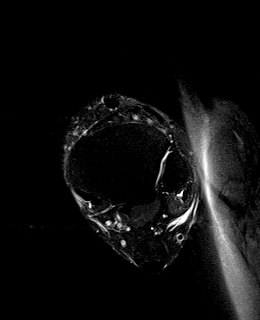
[im 29/36]
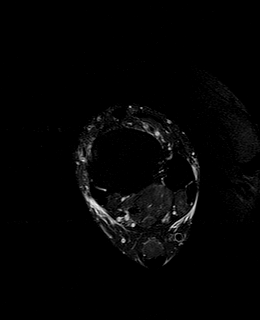
[im 32/36]
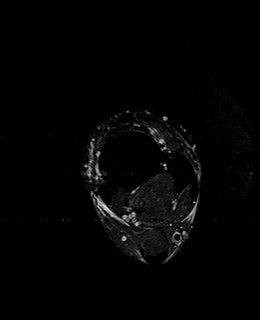
[im 36/36]
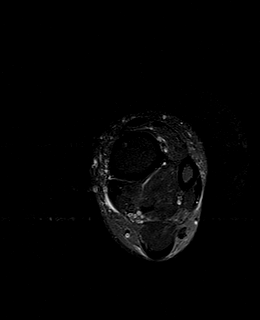

[Series 5: PD fat-sat · axial · 3.5mm · 0.47mm/px · z∈[-83,+44]mm · 10 of 30 slices shown]
[im 1/30]
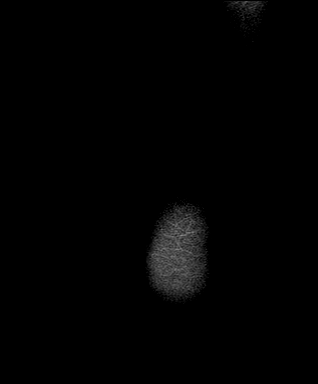
[im 4/30]
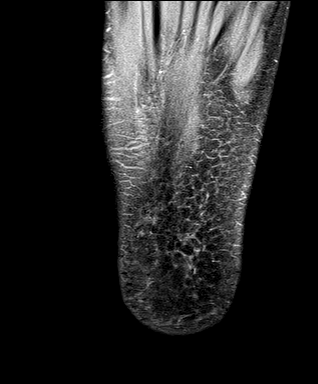
[im 7/30]
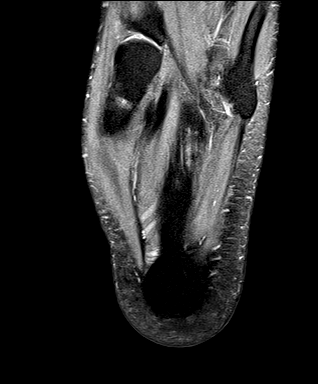
[im 10/30]
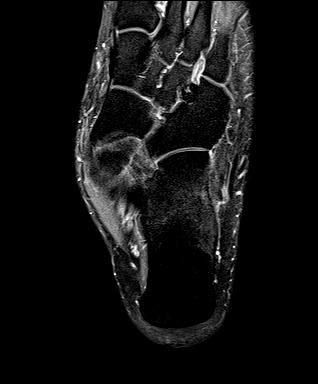
[im 13/30]
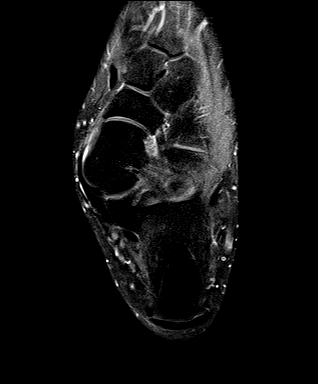
[im 17/30]
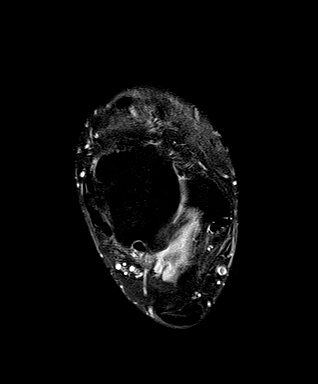
[im 20/30]
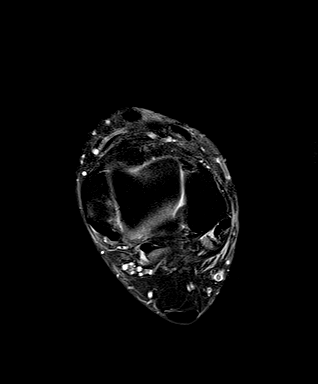
[im 23/30]
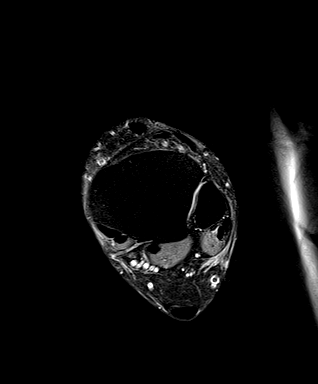
[im 26/30]
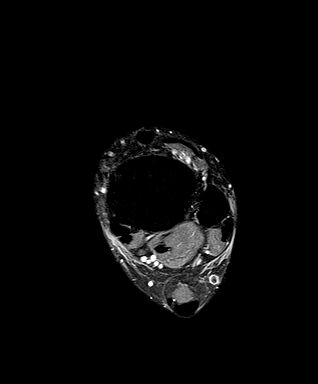
[im 30/30]
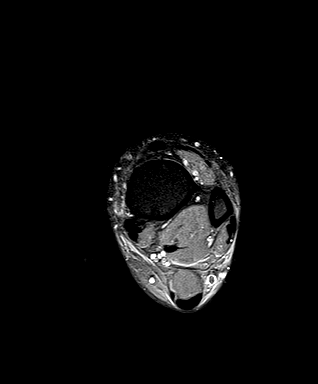

[Series 6: T2 fat-sat · coronal · 3.0mm · 0.62mm/px · 13 of 41 slices shown (2 of 2)]
[im 1/41]
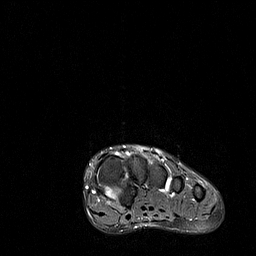
[im 4/41]
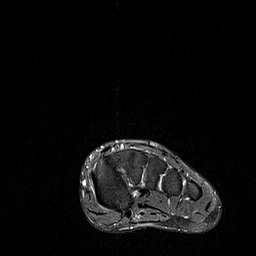
[im 7/41]
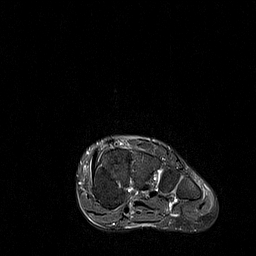
[im 11/41]
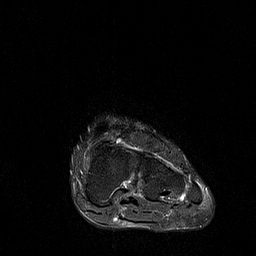
[im 14/41]
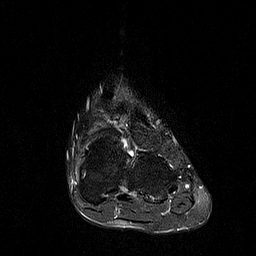
[im 17/41]
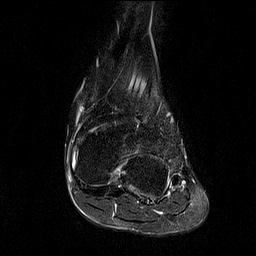
[im 21/41]
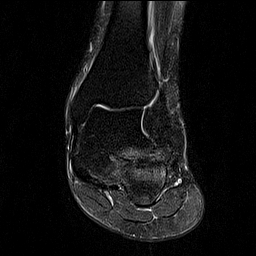
[im 24/41]
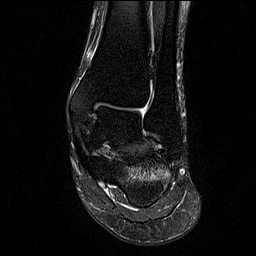
[im 27/41]
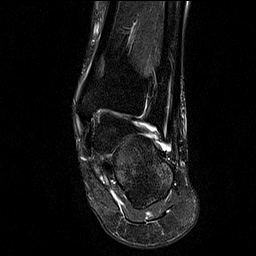
[im 31/41]
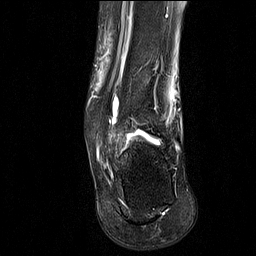
[im 34/41]
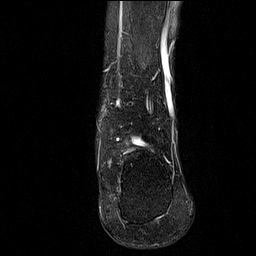
[im 37/41]
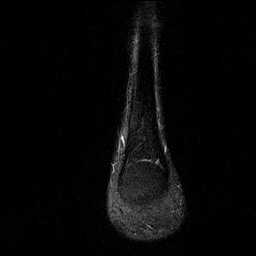
[im 41/41]
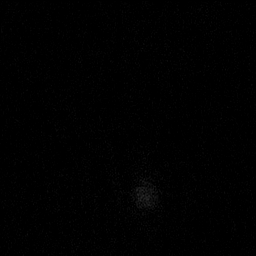

[Series 7: T1 · sagittal · 4.0mm · 0.56mm/px · 7 of 20 slices shown]
[im 1/20]
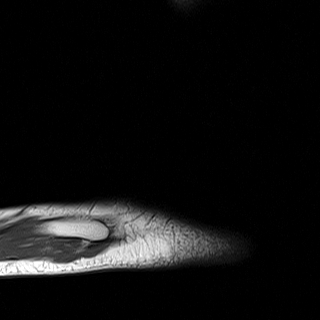
[im 4/20]
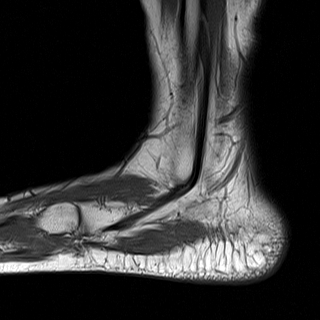
[im 7/20]
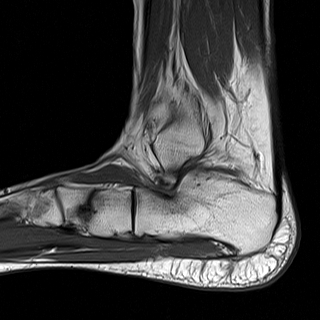
[im 10/20]
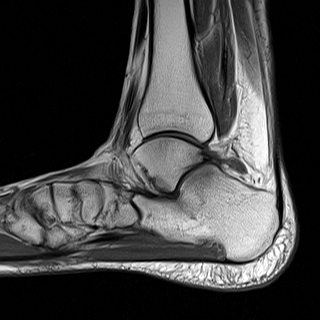
[im 13/20]
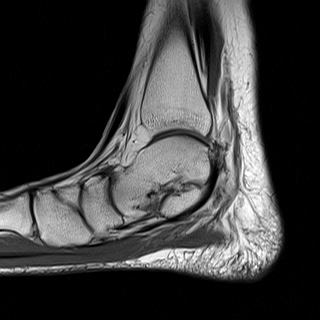
[im 16/20]
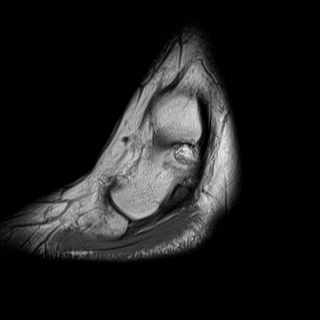
[im 20/20]
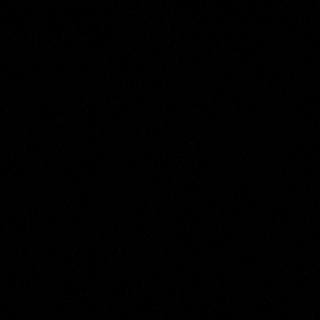

[Series 8: STIR · sagittal · 4.0mm · 0.70mm/px · 7 of 20 slices shown]
[im 1/20]
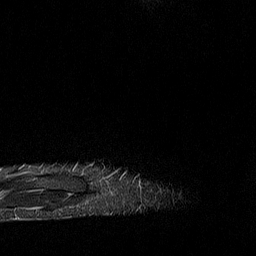
[im 4/20]
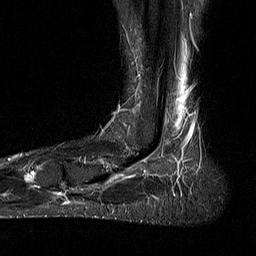
[im 7/20]
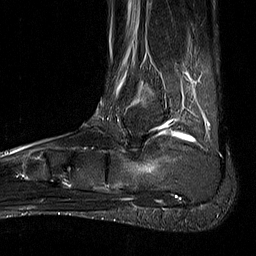
[im 10/20]
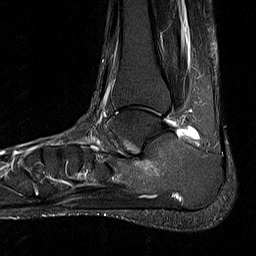
[im 13/20]
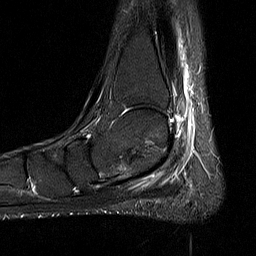
[im 16/20]
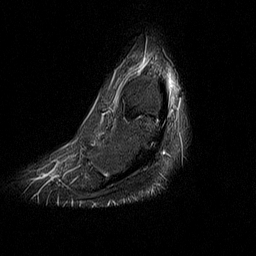
[im 20/20]
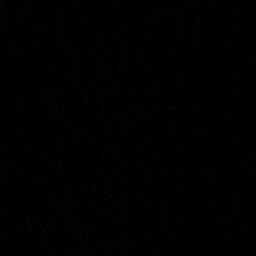

[48 of 48 positions shown; findings below may reference images not displayed]

FINDINGS: TENDONS

Peroneal: Intact.

Posteromedial: Intact.

Anterior: Intact.

Achilles: Intact.

Plantar Fascia: Intact.

LIGAMENTS

Lateral: Intact.

Medial: Intact.

CARTILAGE

Ankle Joint: Normal. No osteochondral lesion of the talar dome or
joint effusion.

Subtalar Joints/Sinus Tarsi: Negative.

Bones: No tarsal coalition is identified. There is marrow edema in
the lateral process of the talus and underlying calcaneus. The
hindfoot valgus angle is 46 degrees consistent with severe
deformity. No edema or cystic change is seen in the fibula.

Other: None
IMPRESSION: Marked hindfoot valgus deformity with associated marrow edema in the
lateral process of the talus and adjacent calcaneus.

Negative for tarsal coalition.

Tendons and ligaments are intact.

## 2021-07-23 MED ORDER — METOPROLOL SUCCINATE ER 50 MG PO TB24
50.0000 mg | ORAL_TABLET | Freq: Every day | ORAL | 0 refills | Status: DC
Start: 2021-07-23 — End: 2021-08-22

## 2021-07-23 NOTE — Telephone Encounter (Signed)
?*  STAT* If patient is at the pharmacy, call can be transferred to refill team. ? ? ?1. Which medications need to be refilled? (please list name of each medication and dose if known) Metoprolol ? ?2. Which pharmacy/location (including street and city if local pharmacy) is medication to be sent to?[facility] [address], ,,[place] ? ?3. Do they need a 30 day or 90 day supply? Enough until his appointment on 08-22-21 ? ?

## 2021-07-24 ENCOUNTER — Other Ambulatory Visit: Payer: Self-pay | Admitting: Cardiology

## 2021-07-24 DIAGNOSIS — M25672 Stiffness of left ankle, not elsewhere classified: Secondary | ICD-10-CM | POA: Diagnosis not present

## 2021-07-24 DIAGNOSIS — M25572 Pain in left ankle and joints of left foot: Secondary | ICD-10-CM | POA: Diagnosis not present

## 2021-07-24 DIAGNOSIS — M62572 Muscle wasting and atrophy, not elsewhere classified, left ankle and foot: Secondary | ICD-10-CM | POA: Diagnosis not present

## 2021-07-24 DIAGNOSIS — M6700 Short Achilles tendon (acquired), unspecified ankle: Secondary | ICD-10-CM | POA: Diagnosis not present

## 2021-07-29 ENCOUNTER — Ambulatory Visit (INDEPENDENT_AMBULATORY_CARE_PROVIDER_SITE_OTHER): Payer: BC Managed Care – PPO

## 2021-07-29 ENCOUNTER — Encounter: Payer: Self-pay | Admitting: Podiatry

## 2021-07-29 ENCOUNTER — Ambulatory Visit (INDEPENDENT_AMBULATORY_CARE_PROVIDER_SITE_OTHER): Payer: BC Managed Care – PPO | Admitting: Podiatry

## 2021-07-29 DIAGNOSIS — Q6652 Congenital pes planus, left foot: Secondary | ICD-10-CM

## 2021-07-29 DIAGNOSIS — M7752 Other enthesopathy of left foot: Secondary | ICD-10-CM

## 2021-07-29 DIAGNOSIS — R52 Pain, unspecified: Secondary | ICD-10-CM

## 2021-07-29 MED ORDER — BETAMETHASONE SOD PHOS & ACET 6 (3-3) MG/ML IJ SUSP
3.0000 mg | Freq: Once | INTRAMUSCULAR | Status: AC
  Administered 2021-07-29: 3 mg via INTRA_ARTICULAR

## 2021-07-29 MED ORDER — DICLOFENAC SODIUM 75 MG PO TBEC: 75.0000 mg | DELAYED_RELEASE_TABLET | Freq: Two times a day (BID) | ORAL | 3 refills | Status: DC

## 2021-07-29 NOTE — Progress Notes (Signed)
? ?  HPI: 31 y.o. male presenting today for new complaint of pain to the left anterior ankle.  Patient states that he is experiencing significant amount of left ankle pain.  This developed after surgery.  He does have history of flatfoot reconstruction including Tinia Oravec, cotton, medial slide calcaneal osteotomy, tendo Achilles lengthening LT 09/20/2020.  Patient has no pain associated to the surgical sites.  He is experiencing left lateral anterior ankle pain.  He denies a history of injury or tripping and falling. ? ?Past Medical History:  ?Diagnosis Date  ? Headache   ? ? ?Past Surgical History:  ?Procedure Laterality Date  ? EYE SURGERY    ? HERNIA REPAIR    ? LEFT HEART CATH AND CORONARY ANGIOGRAPHY N/A 10/29/2018  ? Procedure: LEFT HEART CATH AND CORONARY ANGIOGRAPHY;  Surgeon: Nelva Bush, MD;  Location: [facility];  Service: Cardiovascular;  Laterality: N/A;  ? SVT ABLATION N/A 12/24/2018  ? Procedure: SVT ABLATION;  Surgeon: Constance Haw, MD;  Location: [facility];  Service: Cardiovascular;  Laterality: N/A;  ? ? ?No Known Allergies ?  ?Physical Exam: ?General: The patient is alert and oriented x3 in no acute distress. ? ?Dermatology: Skin is warm, dry and supple bilateral lower extremities. Negative for open lesions or macerations. ? ?Vascular: Palpable pedal pulses bilaterally. Capillary refill within normal limits.  Negative for any significant edema or erythema ? ?Neurological: Light touch and protective threshold grossly intact ? ?Musculoskeletal Exam: Status post left flatfoot reconstructive surgery 09/20/2020.  There is pain on palpation to the anterolateral aspect of the left ankle joint. ? ?Radiographic Exam:  ?Normal osseous mineralization. Joint spaces preserved. No fracture/dislocation/boney destruction.  Orthopedic hardware is intact.  Osteotomy sites and bone graft incorporation appears healthy with good healing ? ?Assessment: ?1.  S/P. Mega Kinkade, cotton, medial slide calcaneal  osteotomy, tendo Achilles lengthening LT 09/20/2020 ?2.  Capsulitis left ankle ? ? ?Plan of Care:  ?1. Patient evaluated. X-Rays reviewed.  ?2.  Injection of 0.5 cc Celestone Soluspan injected into the left ankle joint lateral aspect ?3.  Refill prescription for diclofenac 75 mg 2 times daily ?4.  Appointment with Pedorthist for new custom molded orthotics.  The patient used to wear custom molded orthotics prior to surgery.  I do believe this will be beneficial since the patient works on his feet throughout the majority of the day ?5.  Return to clinic 4 weeks ? ?  ?  ?Edrick Kins, DPM ?[place] ? ?Dr. Edrick Kins, DPM  ?  ?[address]                                        ?[place], [place] [address]                ?Office [phone]  ?Fax [phone] ? ? ? ? ?

## 2021-07-30 DIAGNOSIS — M25672 Stiffness of left ankle, not elsewhere classified: Secondary | ICD-10-CM | POA: Diagnosis not present

## 2021-07-30 DIAGNOSIS — M62572 Muscle wasting and atrophy, not elsewhere classified, left ankle and foot: Secondary | ICD-10-CM | POA: Diagnosis not present

## 2021-07-30 DIAGNOSIS — M6700 Short Achilles tendon (acquired), unspecified ankle: Secondary | ICD-10-CM | POA: Diagnosis not present

## 2021-07-30 DIAGNOSIS — M25572 Pain in left ankle and joints of left foot: Secondary | ICD-10-CM | POA: Diagnosis not present

## 2021-07-31 ENCOUNTER — Ambulatory Visit (INDEPENDENT_AMBULATORY_CARE_PROVIDER_SITE_OTHER): Payer: BC Managed Care – PPO

## 2021-07-31 DIAGNOSIS — Q6689 Other  specified congenital deformities of feet: Secondary | ICD-10-CM

## 2021-07-31 DIAGNOSIS — M2142 Flat foot [pes planus] (acquired), left foot: Secondary | ICD-10-CM

## 2021-07-31 DIAGNOSIS — M2141 Flat foot [pes planus] (acquired), right foot: Secondary | ICD-10-CM

## 2021-07-31 NOTE — Progress Notes (Signed)
SITUATION ?Reason for Consult: Evaluation for Bilateral Custom Foot Orthoses ?Patient / Caregiver Report: Patient is ready for foot orthotics ? ?OBJECTIVE DATA: ?Patient History / Diagnosis:  ?  ICD-10-CM   ?1. Pes planus of both feet  M21.41   ? M21.42   ?  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  ? ? ?Current or Previous Devices:   Historical user ? ?Foot Examination: ?Skin presentation:   Intact ?Ulcers & Callousing:   None ?Toe / Foot Deformities:  Pes planus ?Weight Bearing Presentation:  Planus ?Sensation:    Intact ? ?Shoe Size:    5M ? ?ORTHOTIC RECOMMENDATION ?Recommended Device: 1x pair of custom functional foot orthotics ? ?GOALS OF ORTHOSES ?- Reduce Pain ?- Prevent Foot Deformity ?- Prevent Progression of Further Foot Deformity ?- Relieve Pressure ?- Improve the Overall Biomechanical Function of the Foot and Lower Extremity. ? ?ACTIONS PERFORMED ?Potential out of pocket cost was communicated to patient. Patient understood and consent to casting. Patient was casted for Foot Orthoses via crush box. Procedure was explained and patient tolerated procedure well. Casts were shipped to central fabrication. All questions were answered and concerns addressed. ? ?PLAN ?Patient is to be called for fitting when devices are ready.  ? ? ?

## 2021-08-01 DIAGNOSIS — M25572 Pain in left ankle and joints of left foot: Secondary | ICD-10-CM | POA: Diagnosis not present

## 2021-08-01 DIAGNOSIS — M25672 Stiffness of left ankle, not elsewhere classified: Secondary | ICD-10-CM | POA: Diagnosis not present

## 2021-08-01 DIAGNOSIS — M62572 Muscle wasting and atrophy, not elsewhere classified, left ankle and foot: Secondary | ICD-10-CM | POA: Diagnosis not present

## 2021-08-01 DIAGNOSIS — M6700 Short Achilles tendon (acquired), unspecified ankle: Secondary | ICD-10-CM | POA: Diagnosis not present

## 2021-08-05 DIAGNOSIS — M6700 Short Achilles tendon (acquired), unspecified ankle: Secondary | ICD-10-CM | POA: Diagnosis not present

## 2021-08-05 DIAGNOSIS — M25572 Pain in left ankle and joints of left foot: Secondary | ICD-10-CM | POA: Diagnosis not present

## 2021-08-05 DIAGNOSIS — M25672 Stiffness of left ankle, not elsewhere classified: Secondary | ICD-10-CM | POA: Diagnosis not present

## 2021-08-05 DIAGNOSIS — M62572 Muscle wasting and atrophy, not elsewhere classified, left ankle and foot: Secondary | ICD-10-CM | POA: Diagnosis not present

## 2021-08-07 DIAGNOSIS — M25572 Pain in left ankle and joints of left foot: Secondary | ICD-10-CM | POA: Diagnosis not present

## 2021-08-07 DIAGNOSIS — M6700 Short Achilles tendon (acquired), unspecified ankle: Secondary | ICD-10-CM | POA: Diagnosis not present

## 2021-08-07 DIAGNOSIS — M62572 Muscle wasting and atrophy, not elsewhere classified, left ankle and foot: Secondary | ICD-10-CM | POA: Diagnosis not present

## 2021-08-07 DIAGNOSIS — M25672 Stiffness of left ankle, not elsewhere classified: Secondary | ICD-10-CM | POA: Diagnosis not present

## 2021-08-12 DIAGNOSIS — M25572 Pain in left ankle and joints of left foot: Secondary | ICD-10-CM | POA: Diagnosis not present

## 2021-08-12 DIAGNOSIS — M62572 Muscle wasting and atrophy, not elsewhere classified, left ankle and foot: Secondary | ICD-10-CM | POA: Diagnosis not present

## 2021-08-12 DIAGNOSIS — M25672 Stiffness of left ankle, not elsewhere classified: Secondary | ICD-10-CM | POA: Diagnosis not present

## 2021-08-12 DIAGNOSIS — M6700 Short Achilles tendon (acquired), unspecified ankle: Secondary | ICD-10-CM | POA: Diagnosis not present

## 2021-08-13 DIAGNOSIS — M6700 Short Achilles tendon (acquired), unspecified ankle: Secondary | ICD-10-CM | POA: Diagnosis not present

## 2021-08-13 DIAGNOSIS — M62572 Muscle wasting and atrophy, not elsewhere classified, left ankle and foot: Secondary | ICD-10-CM | POA: Diagnosis not present

## 2021-08-13 DIAGNOSIS — M25672 Stiffness of left ankle, not elsewhere classified: Secondary | ICD-10-CM | POA: Diagnosis not present

## 2021-08-13 DIAGNOSIS — M25572 Pain in left ankle and joints of left foot: Secondary | ICD-10-CM | POA: Diagnosis not present

## 2021-08-21 NOTE — Progress Notes (Unsigned)
Cardiology Office Note Date:  08/22/2021  Patient ID:  Daniel Golden, Daniel Golden 28-Oct-1990, MRN [id number] PCP:  Patient, No Pcp Per (Inactive)  Electrophysiologist: Dr. Curt Bears    Chief Complaint:  over due  History of Present Illness: Daniel Golden is a 31 y.o. male with history of SVT  He presented to hospital 10/28/2018 with palpitations and chest pain.  He was found to have mild ST elevations.  Left heart catheterization showed no evidence of coronary artery disease and this was thought due to coronary spasm.  He continued to have palpitations with SVT noted on telemetry.  He was started on a beta-blocker and discharge from the hospital.  He had an EP study 12/24/2018 without inducible arrhythmia  He comes in today to be seen for Dr. Curt Bears, last seen by him Jan 2022, doing well, no recurrent palpitations, CP.  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  He was pending a foot surgery, felt to be low risk, no further testing pre-op.  June 2022, messages with recurrent SVTs, recommended to start Toprol   TODAY He is doing quite well Works a fairly physical job, sounds like a [place] work, active outside of work  No CP, SOB, DOE or exertional intolerances. No palpitations since the addition of Toprol. No near syncope or syncope.   Past Medical History:  Diagnosis Date   Headache     Past Surgical History:  Procedure Laterality Date   EYE SURGERY     HERNIA REPAIR     LEFT HEART CATH AND CORONARY ANGIOGRAPHY N/A 10/29/2018   Procedure: LEFT HEART CATH AND CORONARY ANGIOGRAPHY;  Surgeon: Nelva Bush, MD;  Location: [facility];  Service: Cardiovascular;  Laterality: N/A;   SVT ABLATION N/A 12/24/2018   Procedure: SVT ABLATION;  Surgeon: Constance Haw, MD;  Location: [facility];  Service: Cardiovascular;  Laterality: N/A;    Current Outpatient  Medications  Medication Sig Dispense Refill   acetaminophen (TYLENOL) 500 MG tablet Take 500 mg by mouth every 6 (six) hours as needed for moderate pain or headache.     diclofenac (VOLTAREN) 75 MG EC tablet Take 1 tablet (75 mg total) by mouth 2 (two) times daily. 60 tablet 3   diltiazem (CARDIZEM) 30 MG tablet TAKE 1 TABLET (30 MG TOTAL) BY MOUTH 4 (FOUR) TIMES DAILY AS NEEDED (FOR ELEVATED HEART RATES). 90 tablet 1   metoprolol succinate (TOPROL-XL) 50 MG 24 hr tablet Take 1 tablet (50 mg total) by mouth daily. Take with or immediately following a meal. 90 tablet 1   No current facility-administered medications for this visit.    Allergies:   Patient has no known allergies.   Social History:  The patient  reports that he has never smoked. He has never used smokeless tobacco. He reports current alcohol use. He reports that he does not use drugs.   Family History:  The patient's family history includes Diabetes in his father and mother; Heart failure in his father and mother.  ROS:  Please see the history of present illness.    All other systems are reviewed and otherwise negative.   PHYSICAL EXAM:  VS:  BP 112/60   Pulse 71   Ht 5\' 9"  (1.753 m)   Wt 173 lb (78.5 kg)   SpO2 98%   BMI 25.55 kg/m  BMI: Body mass index is 25.55 kg/m.  Well nourished, well developed, in no acute distress HEENT: normocephalic, atraumatic Neck: no JVD, carotid bruits or masses Cardiac:   RRR; no significant murmurs, no rubs, or gallops Lungs:  CTA b/l, no wheezing, rhonchi or rales Abd: soft, nontender MS: no deformity or atrophy Ext:  no edema Skin: warm and dry, no rash Neuro:  No gross deficits appreciated Psych: euthymic mood, full affect   EKG:  Done today and reviewed by myself shows  SR 71 bpm, inf/lat ST/T changes  12/24/2018: EPS/ablation CONCLUSIONS:  1. Sinus rhythm upon presentation.  2. No evidence of accessory pathway. AH jumps but no echo beats or tachycardia noted.  4. No  inducible arrhythmias following ablation.  5. No early apparent complications.    [place] 11/03/18  Heart rate 72 beats per minutes, sinus rhythm.   No critical or serious events occurred.   Symptoms of fluttering or skipped beats associated with sinus rhythm. 0 atrial fibrillation noted.  10/29/2018: [facility] Conclusions: Mild tapering of the apical LAD, which improves with intracoronary nitroglycerin suggestive of an element of vasospasm.  There is no angiographically significant atherosclerotic coronary artery disease. Coronary arteries arise from expected locations. Low normal left ventricular filling pressure.   Recommendations: Proceed with electrophysiology evaluation and management of supraventricular tachycardia. Primary prevention of coronary artery disease.   TTE 10/28/18  1. The left ventricle has normal systolic function with an ejection fraction of 60-65%. The cavity size was normal. Left ventricular diastolic parameters were normal.  2. The right ventricle has normal systolic function. The cavity was normal.  3. The tricuspid valve is grossly normal.  4. The aortic valve was not well visualized. No stenosis of the aortic valve.  5. The aorta is normal in size and structure.  6. Technically difficult; normal LV function.  Recent Labs: No results found for requested labs within last 8760 hours.  No results found for requested labs within last 8760 hours.   CrCl cannot be calculated (Patient's most recent lab result is older than the maximum 21 days allowed.).   Wt Readings from Last 3 Encounters:  08/22/21 173 lb (78.5 kg)  04/23/20 170 lb (77.1 kg)  09/06/19 155 lb (70.3 kg)     Other studies reviewed: Additional studies/records reviewed today include: summarized above  ASSESSMENT AND PLAN:  SVT None since on Toprol He has never had to use the prn diltiazem   ?spasm Abnormal EKG Reviewed with Dr. Curt Bears, in the absence of symptoms, no medicine changes or new  imaging, though if any symptoms do develop would pursue c.MRI   Disposition: F/u with [place] in 72mo, sooner if needed  Current medicines are reviewed at length with the patient today.  The patient did not have any concerns regarding medicines.  Venetia Night, PA-C 08/22/2021 5:06 PM     [address] [place] [place] [address] [phone] (office)  [phone] (fax)

## 2021-08-22 ENCOUNTER — Ambulatory Visit (INDEPENDENT_AMBULATORY_CARE_PROVIDER_SITE_OTHER): Payer: BC Managed Care – PPO | Admitting: Physician Assistant

## 2021-08-22 ENCOUNTER — Encounter: Payer: Self-pay | Admitting: Physician Assistant

## 2021-08-22 VITALS — BP 112/60 | HR 71 | Ht 69.0 in | Wt 173.0 lb

## 2021-08-22 DIAGNOSIS — R9431 Abnormal electrocardiogram [ECG] [EKG]: Secondary | ICD-10-CM | POA: Diagnosis not present

## 2021-08-22 DIAGNOSIS — I471 Supraventricular tachycardia: Secondary | ICD-10-CM

## 2021-08-22 MED ORDER — METOPROLOL SUCCINATE ER 50 MG PO TB24
50.0000 mg | ORAL_TABLET | Freq: Every day | ORAL | 1 refills | Status: DC
Start: 2021-08-22 — End: 2022-02-27

## 2021-08-22 NOTE — Patient Instructions (Signed)
Medication Instructions:   Your physician recommends that you continue on your current medications as directed. Please refer to the Current Medication list given to you today.  *If you need a refill on your cardiac medications before your next appointment, please call your pharmacy*   Lab Work: NONE ORDERED  TODAY   If you have labs (blood work) drawn today and your tests are completely normal, you will receive your results only by: MyChart Message (if you have MyChart) OR A paper copy in the mail If you have any lab test that is abnormal or we need to change your treatment, we will call you to review the results.   Testing/Procedures: NONE ORDERED  TODAY   Follow-Up: At [facility], you and your health needs are our priority.  As part of our continuing mission to provide you with exceptional heart care, we have created designated Provider Care Teams.  These Care Teams include your primary Cardiologist (physician) and Advanced Practice Providers (APPs -  Physician Assistants and Nurse Practitioners) who all work together to provide you with the care you need, when you need it.  We recommend signing up for the patient portal called "MyChart".  Sign up information is provided on this After Visit Summary.  MyChart is used to connect with patients for Virtual Visits (Telemedicine).  Patients are able to view lab/test results, encounter notes, upcoming appointments, etc.  Non-urgent messages can be sent to your provider as well.   To learn more about what you can do with MyChart, go to [email or web address].    Your next appointment:    4 month(s)  The format for your next appointment:   In Person  Provider:   You may see Will Jorja Loa, MD  or one of the following Advanced Practice Providers on your designated Care Team:   Francis Dowse, [place]   Other Instructions   Important Information About Sugar

## 2021-08-26 DIAGNOSIS — M6700 Short Achilles tendon (acquired), unspecified ankle: Secondary | ICD-10-CM | POA: Diagnosis not present

## 2021-08-26 DIAGNOSIS — M25572 Pain in left ankle and joints of left foot: Secondary | ICD-10-CM | POA: Diagnosis not present

## 2021-08-26 DIAGNOSIS — M25672 Stiffness of left ankle, not elsewhere classified: Secondary | ICD-10-CM | POA: Diagnosis not present

## 2021-08-26 DIAGNOSIS — M62572 Muscle wasting and atrophy, not elsewhere classified, left ankle and foot: Secondary | ICD-10-CM | POA: Diagnosis not present

## 2021-08-28 DIAGNOSIS — M25672 Stiffness of left ankle, not elsewhere classified: Secondary | ICD-10-CM | POA: Diagnosis not present

## 2021-08-28 DIAGNOSIS — M6700 Short Achilles tendon (acquired), unspecified ankle: Secondary | ICD-10-CM | POA: Diagnosis not present

## 2021-08-28 DIAGNOSIS — M25572 Pain in left ankle and joints of left foot: Secondary | ICD-10-CM | POA: Diagnosis not present

## 2021-08-28 DIAGNOSIS — M62572 Muscle wasting and atrophy, not elsewhere classified, left ankle and foot: Secondary | ICD-10-CM | POA: Diagnosis not present

## 2021-09-02 ENCOUNTER — Ambulatory Visit (INDEPENDENT_AMBULATORY_CARE_PROVIDER_SITE_OTHER): Payer: BC Managed Care – PPO | Admitting: Podiatry

## 2021-09-02 ENCOUNTER — Ambulatory Visit: Payer: BC Managed Care – PPO

## 2021-09-02 DIAGNOSIS — M7752 Other enthesopathy of left foot: Secondary | ICD-10-CM

## 2021-09-02 DIAGNOSIS — M25572 Pain in left ankle and joints of left foot: Secondary | ICD-10-CM | POA: Diagnosis not present

## 2021-09-02 DIAGNOSIS — M62572 Muscle wasting and atrophy, not elsewhere classified, left ankle and foot: Secondary | ICD-10-CM | POA: Diagnosis not present

## 2021-09-02 DIAGNOSIS — M2141 Flat foot [pes planus] (acquired), right foot: Secondary | ICD-10-CM

## 2021-09-02 DIAGNOSIS — M25672 Stiffness of left ankle, not elsewhere classified: Secondary | ICD-10-CM | POA: Diagnosis not present

## 2021-09-02 DIAGNOSIS — M2142 Flat foot [pes planus] (acquired), left foot: Secondary | ICD-10-CM

## 2021-09-02 DIAGNOSIS — M6700 Short Achilles tendon (acquired), unspecified ankle: Secondary | ICD-10-CM | POA: Diagnosis not present

## 2021-09-02 NOTE — Progress Notes (Signed)
SITUATION Reason for Consult: Evaluation for Bilateral Custom Foot Orthoses Patient / Caregiver Report: Patient is ready for foot orthotics  OBJECTIVE DATA: Patient History / Diagnosis:    ICD-10-CM   1. Pes planus of both feet  M21.41    M21.42       Current or Previous Devices:   Historical user  Foot Examination: Skin presentation:   Intact Ulcers & Callousing:   None Toe / Foot Deformities:  Pes planus Weight Bearing Presentation:  planus Sensation:    Intact  Shoe Size:    97M  ORTHOTIC RECOMMENDATION Recommended Device: 1x pair of custom functional foot orthotics  GOALS OF ORTHOSES - Reduce Pain - Prevent Foot Deformity - Prevent Progression of Further Foot Deformity - Relieve Pressure - Improve the Overall Biomechanical Function of the Foot and Lower Extremity.  ACTIONS PERFORMED Potential out of pocket cost was communicated to patient. Patient understood and consent to casting. Patient was casted for Foot Orthoses via crush box. Procedure was explained and patient tolerated procedure well. Casts were shipped to central fabrication. All questions were answered and concerns addressed.  PLAN Patient is to be called for fitting when devices are ready.

## 2021-09-03 DIAGNOSIS — M7752 Other enthesopathy of left foot: Secondary | ICD-10-CM | POA: Diagnosis not present

## 2021-09-03 MED ORDER — BETAMETHASONE SOD PHOS & ACET 6 (3-3) MG/ML IJ SUSP
3.0000 mg | Freq: Once | INTRAMUSCULAR | Status: AC
  Administered 2021-09-03: 3 mg via INTRA_ARTICULAR

## 2021-09-03 NOTE — Progress Notes (Signed)
   HPI: 31 y.o. male presenting today for follow-up evaluation of pain to the left anterior ankle.  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  Patient has no pain associated to the surgical sites.   Patient states that the injection helped significantly that he received on 07/29/2021.  He also has an appointment today with our Pedorthist for custom molded orthotics.  Past Medical History:  Diagnosis Date   Headache     Past Surgical History:  Procedure Laterality Date   EYE SURGERY     HERNIA REPAIR     LEFT HEART CATH AND CORONARY ANGIOGRAPHY N/A 10/29/2018   Procedure: LEFT HEART CATH AND CORONARY ANGIOGRAPHY;  Surgeon: Nelva Bush, MD;  Location: [place] [facility];  Service: Cardiovascular;  Laterality: N/A;   SVT ABLATION N/A 12/24/2018   Procedure: SVT ABLATION;  Surgeon: Constance Haw, MD;  Location: [facility];  Service: Cardiovascular;  Laterality: N/A;    No Known Allergies   Physical Exam: General: The patient is alert and oriented x3 in no acute distress.  Dermatology: Skin is warm, dry and supple bilateral lower extremities. Negative for open lesions or macerations.  Vascular: Palpable pedal pulses bilaterally. Capillary refill within normal limits.  Negative for any significant edema or erythema  Neurological: Light touch and protective threshold grossly intact  Musculoskeletal Exam: Status post left flatfoot reconstructive surgery 09/20/2020.  There continues to be some pain on palpation to the anterolateral aspect of the left ankle joint.  Radiographic Exam LT foot 07/29/2021:  Normal osseous mineralization. Joint spaces preserved. No fracture/dislocation/boney destruction.  Orthopedic hardware is intact.  Osteotomy sites and bone graft incorporation appears healthy with good healing  Assessment: 1.  S/P. Dalma Panchal, cotton, medial slide calcaneal  osteotomy, tendo Achilles lengthening LT 09/20/2020 2.  Capsulitis left ankle   Plan of Care:  1. Patient evaluated. 2.  Injection of 0.5 cc Celestone Soluspan injected into the left ankle joint lateral aspect 3.  Continue diclofenac 75 mg 2 times daily 4.  Patient meeting with the Pedorthist today for new custom molded orthotics 5.  Return to clinic approximately 1 month after wearing the orthotics for reevaluation     Edrick Kins, DPM [facility]  Dr. Edrick Kins, DPM    [address],  [address]                Office [phone]  Fax [phone]

## 2021-09-04 DIAGNOSIS — M25572 Pain in left ankle and joints of left foot: Secondary | ICD-10-CM | POA: Diagnosis not present

## 2021-09-04 DIAGNOSIS — M6700 Short Achilles tendon (acquired), unspecified ankle: Secondary | ICD-10-CM | POA: Diagnosis not present

## 2021-09-04 DIAGNOSIS — M25672 Stiffness of left ankle, not elsewhere classified: Secondary | ICD-10-CM | POA: Diagnosis not present

## 2021-09-04 DIAGNOSIS — M62572 Muscle wasting and atrophy, not elsewhere classified, left ankle and foot: Secondary | ICD-10-CM | POA: Diagnosis not present

## 2021-09-09 DIAGNOSIS — M6700 Short Achilles tendon (acquired), unspecified ankle: Secondary | ICD-10-CM | POA: Diagnosis not present

## 2021-09-09 DIAGNOSIS — M25672 Stiffness of left ankle, not elsewhere classified: Secondary | ICD-10-CM | POA: Diagnosis not present

## 2021-09-09 DIAGNOSIS — M62572 Muscle wasting and atrophy, not elsewhere classified, left ankle and foot: Secondary | ICD-10-CM | POA: Diagnosis not present

## 2021-09-09 DIAGNOSIS — M25572 Pain in left ankle and joints of left foot: Secondary | ICD-10-CM | POA: Diagnosis not present

## 2021-09-11 ENCOUNTER — Other Ambulatory Visit: Payer: BC Managed Care – PPO

## 2021-09-11 DIAGNOSIS — M6700 Short Achilles tendon (acquired), unspecified ankle: Secondary | ICD-10-CM | POA: Diagnosis not present

## 2021-09-11 DIAGNOSIS — M62572 Muscle wasting and atrophy, not elsewhere classified, left ankle and foot: Secondary | ICD-10-CM | POA: Diagnosis not present

## 2021-09-11 DIAGNOSIS — M25572 Pain in left ankle and joints of left foot: Secondary | ICD-10-CM | POA: Diagnosis not present

## 2021-09-11 DIAGNOSIS — M25672 Stiffness of left ankle, not elsewhere classified: Secondary | ICD-10-CM | POA: Diagnosis not present

## 2021-09-18 ENCOUNTER — Encounter: Payer: Self-pay | Admitting: Podiatry

## 2021-09-18 DIAGNOSIS — M25672 Stiffness of left ankle, not elsewhere classified: Secondary | ICD-10-CM | POA: Diagnosis not present

## 2021-09-18 DIAGNOSIS — M6700 Short Achilles tendon (acquired), unspecified ankle: Secondary | ICD-10-CM | POA: Diagnosis not present

## 2021-09-18 DIAGNOSIS — M62572 Muscle wasting and atrophy, not elsewhere classified, left ankle and foot: Secondary | ICD-10-CM | POA: Diagnosis not present

## 2021-09-18 DIAGNOSIS — M25572 Pain in left ankle and joints of left foot: Secondary | ICD-10-CM | POA: Diagnosis not present

## 2021-09-19 NOTE — Telephone Encounter (Signed)
Please advise 

## 2021-09-20 NOTE — Telephone Encounter (Signed)
I let him know you are out of the office and you will get back to him next week.

## 2021-09-26 DIAGNOSIS — M6700 Short Achilles tendon (acquired), unspecified ankle: Secondary | ICD-10-CM | POA: Diagnosis not present

## 2021-09-26 DIAGNOSIS — M25672 Stiffness of left ankle, not elsewhere classified: Secondary | ICD-10-CM | POA: Diagnosis not present

## 2021-09-26 DIAGNOSIS — M62572 Muscle wasting and atrophy, not elsewhere classified, left ankle and foot: Secondary | ICD-10-CM | POA: Diagnosis not present

## 2021-09-26 DIAGNOSIS — M25572 Pain in left ankle and joints of left foot: Secondary | ICD-10-CM | POA: Diagnosis not present

## 2021-09-30 ENCOUNTER — Other Ambulatory Visit: Payer: BC Managed Care – PPO

## 2021-09-30 DIAGNOSIS — M25572 Pain in left ankle and joints of left foot: Secondary | ICD-10-CM | POA: Diagnosis not present

## 2021-09-30 DIAGNOSIS — M25672 Stiffness of left ankle, not elsewhere classified: Secondary | ICD-10-CM | POA: Diagnosis not present

## 2021-09-30 DIAGNOSIS — M62572 Muscle wasting and atrophy, not elsewhere classified, left ankle and foot: Secondary | ICD-10-CM | POA: Diagnosis not present

## 2021-09-30 DIAGNOSIS — M6700 Short Achilles tendon (acquired), unspecified ankle: Secondary | ICD-10-CM | POA: Diagnosis not present

## 2021-10-01 ENCOUNTER — Ambulatory Visit (INDEPENDENT_AMBULATORY_CARE_PROVIDER_SITE_OTHER): Payer: BC Managed Care – PPO

## 2021-10-01 DIAGNOSIS — M2141 Flat foot [pes planus] (acquired), right foot: Secondary | ICD-10-CM

## 2021-10-01 DIAGNOSIS — M2142 Flat foot [pes planus] (acquired), left foot: Secondary | ICD-10-CM

## 2021-10-01 NOTE — Progress Notes (Signed)
Reason for Visit:        Fitting and Delivery of Custom Fabricated Foot Orthotics Patient Report:            Patient reports comfort and is satisfied with device.   ACTIONS PERFORMED Patient was fit with foot orthotics trimmed to shoe last. Patient tolerated fitting procedure.    Patient was provided with verbal and written instruction and demonstration regarding  wear, care, proper fit, function, purpose, cleaning, and use of the orthosis and in all related precautions and risks and benefits regarding the orthosis.   Patient was also provided with verbal instruction regarding how to report any failures or malfunctions of the orthosis and necessary follow up care. Patient was also instructed to contact our office regarding any change in status that may affect the function of the orthosis.   Patient demonstrated independence with proper donning, doffing, and fit and verbalized understanding of all instructions.   PLAN: Patient is to follow up in one week or as necessary (PRN). All questions were answered and concerns addressed. 

## 2021-10-07 DIAGNOSIS — M25672 Stiffness of left ankle, not elsewhere classified: Secondary | ICD-10-CM | POA: Diagnosis not present

## 2021-10-07 DIAGNOSIS — M62572 Muscle wasting and atrophy, not elsewhere classified, left ankle and foot: Secondary | ICD-10-CM | POA: Diagnosis not present

## 2021-10-07 DIAGNOSIS — M25572 Pain in left ankle and joints of left foot: Secondary | ICD-10-CM | POA: Diagnosis not present

## 2021-10-07 DIAGNOSIS — M6700 Short Achilles tendon (acquired), unspecified ankle: Secondary | ICD-10-CM | POA: Diagnosis not present

## 2021-10-16 DIAGNOSIS — H26492 Other secondary cataract, left eye: Secondary | ICD-10-CM | POA: Diagnosis not present

## 2021-10-23 DIAGNOSIS — M25672 Stiffness of left ankle, not elsewhere classified: Secondary | ICD-10-CM | POA: Diagnosis not present

## 2021-10-23 DIAGNOSIS — M62572 Muscle wasting and atrophy, not elsewhere classified, left ankle and foot: Secondary | ICD-10-CM | POA: Diagnosis not present

## 2021-10-23 DIAGNOSIS — M6700 Short Achilles tendon (acquired), unspecified ankle: Secondary | ICD-10-CM | POA: Diagnosis not present

## 2021-10-23 DIAGNOSIS — M25572 Pain in left ankle and joints of left foot: Secondary | ICD-10-CM | POA: Diagnosis not present

## 2021-10-30 DIAGNOSIS — M62572 Muscle wasting and atrophy, not elsewhere classified, left ankle and foot: Secondary | ICD-10-CM | POA: Diagnosis not present

## 2021-10-30 DIAGNOSIS — M25572 Pain in left ankle and joints of left foot: Secondary | ICD-10-CM | POA: Diagnosis not present

## 2021-10-30 DIAGNOSIS — M25672 Stiffness of left ankle, not elsewhere classified: Secondary | ICD-10-CM | POA: Diagnosis not present

## 2021-10-30 DIAGNOSIS — M6700 Short Achilles tendon (acquired), unspecified ankle: Secondary | ICD-10-CM | POA: Diagnosis not present

## 2021-11-06 DIAGNOSIS — M25572 Pain in left ankle and joints of left foot: Secondary | ICD-10-CM | POA: Diagnosis not present

## 2021-11-06 DIAGNOSIS — M6700 Short Achilles tendon (acquired), unspecified ankle: Secondary | ICD-10-CM | POA: Diagnosis not present

## 2021-11-06 DIAGNOSIS — M62572 Muscle wasting and atrophy, not elsewhere classified, left ankle and foot: Secondary | ICD-10-CM | POA: Diagnosis not present

## 2021-11-06 DIAGNOSIS — M25672 Stiffness of left ankle, not elsewhere classified: Secondary | ICD-10-CM | POA: Diagnosis not present

## 2021-12-23 NOTE — Progress Notes (Signed)
Cardiology Office Note Date:  12/23/2021  Patient ID:  Daniel Golden May 27, 1990, MRN [id number] PCP:  Patient, No Pcp Per  Electrophysiologist: Dr. Curt Bears    Chief Complaint:  planned f/u  History of Present Illness: Daniel Golden is a 31 y.o. male with history of SVT  He presented to hospital 10/28/2018 with palpitations and chest pain.  He was found to have mild ST elevations.  Left heart catheterization showed no evidence of coronary artery disease and this was thought due to coronary spasm.  He continued to have palpitations with SVT noted on telemetry.  He was started on a beta-blocker and discharge from the hospital.  He had an EP study 12/24/2018 without inducible arrhythmia  He comes in today to be seen for Dr. Curt Bears, last seen by him Jan 2022, doing well, no recurrent palpitations, CP.  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  He was pending a foot surgery, felt to be low risk, no further testing pre-op.  June 2022, messages with recurrent SVTs, recommended to start Toprol  I saw him 08/22/21 He is doing quite well Works a fairly physical job, sounds like a [place] work, active outside of work  No CP, SOB, DOE or exertional intolerances. No palpitations since the addition of Toprol. No near syncope or syncope. EKG was done since had been over a year, was abnormal and reviewed with Dr. Curt Bears, given no symptoms, no new w/u was recommended, though if he did, would pursue c.MRI  TODAY He is doing very well No CP, palpitations or cardiac awareness No SOB, DOE No syncope or near syncope.  Remains very physically active, has started at a gym  No exertional intolerances   Past Medical History:  Diagnosis Date   Headache     Past Surgical History:  Procedure Laterality Date   EYE SURGERY     HERNIA REPAIR     LEFT HEART CATH AND CORONARY ANGIOGRAPHY N/A  10/29/2018   Procedure: LEFT HEART CATH AND CORONARY ANGIOGRAPHY;  Surgeon: Nelva Bush, MD;  Location: [place] [facility];  Service: Cardiovascular;  Laterality: N/A;   SVT ABLATION N/A 12/24/2018   Procedure: SVT ABLATION;  Surgeon: Constance Haw, MD;  Location: [place] [facility];  Service: Cardiovascular;  Laterality: N/A;    Current Outpatient Medications  Medication Sig Dispense Refill   acetaminophen (TYLENOL) 500 MG tablet Take 500 mg by mouth every 6 (six) hours as needed for moderate pain or headache.     diclofenac (VOLTAREN) 75 MG EC tablet Take 1 tablet (75 mg total) by mouth 2 (two) times daily. 60 tablet 3   diltiazem (CARDIZEM) 30 MG tablet TAKE 1 TABLET (30 MG TOTAL) BY MOUTH 4 (FOUR) TIMES DAILY AS NEEDED (FOR ELEVATED HEART RATES). 90 tablet 1   metoprolol succinate (TOPROL-XL) 50 MG 24 hr tablet Take 1 tablet (50 mg total) by mouth daily. Take with or immediately following a meal. 90 tablet 1   No current facility-administered medications for this visit.    Allergies:   Patient has no known allergies.   Social History:  The patient  reports that he has never smoked. He has never used smokeless tobacco. He reports current alcohol use. He reports that he does not use drugs.   Family History:  The patient's family history includes Diabetes in his father and mother; Heart failure in his father  and mother.  ROS:  Please see the history of present illness.    All other systems are reviewed and otherwise negative.   PHYSICAL EXAM:  VS:  There were no vitals taken for this visit. BMI: There is no height or weight on file to calculate BMI. Well nourished, well developed, in no acute distress HEENT: normocephalic, atraumatic Neck: no JVD, carotid bruits or masses Cardiac:    RRR; no significant murmurs, no rubs, or gallops Lungs:   CTA b/l, no wheezing, rhonchi or rales Abd: soft, nontender MS: no deformity or atrophy Ext:  no edema Skin: warm and dry, no  rash Neuro:  No gross deficits appreciated Psych: euthymic mood, full affect   EKG:  Done today and reviewed by myself shows  SR 65bpm, ST/T changes appear similar to his last  12/24/2018: EPS/ablation CONCLUSIONS:  1. Sinus rhythm upon presentation.  2. No evidence of accessory pathway. AH jumps but no echo beats or tachycardia noted.  4. No inducible arrhythmias following ablation.  5. No early apparent complications.    [place] 11/03/18  Heart rate 72 beats per minutes, sinus rhythm.   No critical or serious events occurred.   Symptoms of fluttering or skipped beats associated with sinus rhythm. 0 atrial fibrillation noted.  10/29/2018: [facility] Conclusions: Mild tapering of the apical LAD, which improves with intracoronary nitroglycerin suggestive of an element of vasospasm.  There is no angiographically significant atherosclerotic coronary artery disease. Coronary arteries arise from expected locations. Low normal left ventricular filling pressure.   Recommendations: Proceed with electrophysiology evaluation and management of supraventricular tachycardia. Primary prevention of coronary artery disease.   TTE 10/28/18  1. The left ventricle has normal systolic function with an ejection fraction of 60-65%. The cavity size was normal. Left ventricular diastolic parameters were normal.  2. The right ventricle has normal systolic function. The cavity was normal.  3. The tricuspid valve is grossly normal.  4. The aortic valve was not well visualized. No stenosis of the aortic valve.  5. The aorta is normal in size and structure.  6. Technically difficult; normal LV function.  Recent Labs: No results found for requested labs within last 365 days.  No results found for requested labs within last 365 days.   CrCl cannot be calculated (Patient's most recent lab result is older than the maximum 21 days allowed.).   Wt Readings from Last 3 Encounters:  08/22/21 173 lb (78.5 kg)  04/23/20 170  lb (77.1 kg)  09/06/19 155 lb (70.3 kg)     Other studies reviewed: Additional studies/records reviewed today include: summarized above  ASSESSMENT AND PLAN:  SVT None since on Toprol He has never had to use the prn diltiazem  ?spasm Abnormal EKG No symptoms He has started at a gym, no symptoms, discussed avoiding stimulants, supplements    Disposition: will have him back in 45mo  Current medicines are reviewed at length with the patient today.  The patient did not have any concerns regarding medicines.  Norma Fredrickson, PA-C 12/23/2021 1:08 PM     [facility] [address] [place] [address] [phone] (office)  [phone] (fax)

## 2021-12-24 ENCOUNTER — Encounter: Payer: Self-pay | Admitting: Physician Assistant

## 2021-12-24 ENCOUNTER — Ambulatory Visit: Payer: BC Managed Care – PPO | Attending: Physician Assistant | Admitting: Physician Assistant

## 2021-12-24 VITALS — BP 98/64 | HR 60 | Ht 69.0 in | Wt 181.0 lb

## 2021-12-24 DIAGNOSIS — R9431 Abnormal electrocardiogram [ECG] [EKG]: Secondary | ICD-10-CM | POA: Diagnosis not present

## 2021-12-24 DIAGNOSIS — I471 Supraventricular tachycardia, unspecified: Secondary | ICD-10-CM | POA: Diagnosis not present

## 2021-12-24 NOTE — Patient Instructions (Signed)
Medication Instructions:    Your physician recommends that you continue on your current medications as directed. Please refer to the Current Medication list given to you today.  *If you need a refill on your cardiac medications before your next appointment, please call your pharmacy*   Lab Work: NONE ORDERED  TODAY   If you have labs (blood work) drawn today and your tests are completely normal, you will receive your results only by: MyChart Message (if you have MyChart) OR A paper copy in the mail If you have any lab test that is abnormal or we need to change your treatment, we will call you to review the results.   Testing/Procedures: NONE ORDERED  TODAY    Follow-Up: At [facility], you and your health needs are our priority.  As part of our continuing mission to provide you with exceptional heart care, we have created designated Provider Care Teams.  These Care Teams include your primary Cardiologist (physician) and Advanced Practice Providers (APPs -  Physician Assistants and Nurse Practitioners) who all work together to provide you with the care you need, when you need it.  We recommend signing up for the patient portal called "MyChart".  Sign up information is provided on this After Visit Summary.  MyChart is used to connect with patients for Virtual Visits (Telemedicine).  Patients are able to view lab/test results, encounter notes, upcoming appointments, etc.  Non-urgent messages can be sent to your provider as well.   To learn more about what you can do with MyChart, go to [email or web address].    Your next appointment:   6 month(s)  The format for your next appointment:   In Person  Provider:   You may see Will Martin Camnitz, MD or one of the following Advanced Practice Providers on your designated Care Team:   Renee Ursuy, PA-C Michael "Andy" Tillery, PA-C   Other Instructions   Important Information About Sugar       

## 2021-12-25 NOTE — Addendum Note (Signed)
Addended by: Sharee Holster R on: 12/25/2021 09:42 AM   Modules accepted: Orders

## 2022-01-04 DIAGNOSIS — Z0000 Encounter for general adult medical examination without abnormal findings: Secondary | ICD-10-CM | POA: Diagnosis not present

## 2022-01-04 DIAGNOSIS — R7309 Other abnormal glucose: Secondary | ICD-10-CM | POA: Diagnosis not present

## 2022-01-04 DIAGNOSIS — E559 Vitamin D deficiency, unspecified: Secondary | ICD-10-CM | POA: Diagnosis not present

## 2022-01-04 DIAGNOSIS — Z6833 Body mass index (BMI) 33.0-33.9, adult: Secondary | ICD-10-CM | POA: Diagnosis not present

## 2022-02-26 ENCOUNTER — Other Ambulatory Visit: Payer: Self-pay | Admitting: Physician Assistant

## 2022-04-14 ENCOUNTER — Telehealth: Payer: BC Managed Care – PPO | Admitting: Physician Assistant

## 2022-04-14 DIAGNOSIS — B9689 Other specified bacterial agents as the cause of diseases classified elsewhere: Secondary | ICD-10-CM | POA: Diagnosis not present

## 2022-04-14 DIAGNOSIS — J019 Acute sinusitis, unspecified: Secondary | ICD-10-CM | POA: Diagnosis not present

## 2022-04-14 MED ORDER — AMOXICILLIN-POT CLAVULANATE 875-125 MG PO TABS: 1.0000 | ORAL_TABLET | Freq: Two times a day (BID) | ORAL | 0 refills | Status: DC

## 2022-04-14 MED ORDER — FLUTICASONE PROPIONATE 50 MCG/ACT NA SUSP: 2.0000 | Freq: Every day | NASAL | 0 refills | Status: DC

## 2022-04-14 NOTE — Patient Instructions (Signed)
Zara Council, thank you for joining Leeanne Rio, PA-C for today's virtual visit.  While this provider is not your primary care provider (PCP), if your PCP is located in our provider database this encounter information will be shared with them immediately following your visit.   [facility] account gives you access to today's visit and all your visits, tests, and labs performed at [facility] " click here if you don't have a [facility] account or go to [email or web address]  Consent: (Patient) Zara Council provided verbal consent for this virtual visit at the beginning of the encounter.  Current Medications:  Current Outpatient Medications:    acetaminophen (TYLENOL) 500 MG tablet, Take 500 mg by mouth every 6 (six) hours as needed for moderate pain or headache., Disp: , Rfl:    diclofenac (VOLTAREN) 75 MG EC tablet, Take 1 tablet (75 mg total) by mouth 2 (two) times daily., Disp: 60 tablet, Rfl: 3   diltiazem (CARDIZEM) 30 MG tablet, TAKE 1 TABLET (30 MG TOTAL) BY MOUTH 4 (FOUR) TIMES DAILY AS NEEDED (FOR ELEVATED HEART RATES)., Disp: 90 tablet, Rfl: 1   fexofenadine-pseudoephedrine (ALLEGRA-D 24) 180-240 MG 24 hr tablet, Take 1 tablet by mouth daily., Disp: , Rfl:    metoprolol succinate (TOPROL-XL) 50 MG 24 hr tablet, TAKE 1 TABLET BY MOUTH DAILY. TAKE WITH OR IMMEDIATELY FOLLOWING A MEAL., Disp: 90 tablet, Rfl: 1   Medications ordered in this encounter:  No orders of the defined types were placed in this encounter.    *If you need refills on other medications prior to your next appointment, please contact your pharmacy*  Follow-Up: Call back or seek an in-person evaluation if the symptoms worsen or if the condition fails to improve as anticipated.  [place] [phone]  Other Instructions   Please take antibiotic as directed.  Increase fluid intake.  Use Saline nasal spray.  Take a daily multivitamin. Start the  Flonase once daily.  Place a humidifier in the bedroom.  Please call or return clinic if symptoms are not improving.  Sinusitis Sinusitis is redness, soreness, and swelling (inflammation) of the paranasal sinuses. Paranasal sinuses are air pockets within the bones of your face (beneath the eyes, the middle of the forehead, or above the eyes). In healthy paranasal sinuses, mucus is able to drain out, and air is able to circulate through them by way of your nose. However, when your paranasal sinuses are inflamed, mucus and air can become trapped. This can allow bacteria and other germs to grow and cause infection. Sinusitis can develop quickly and last only a short time (acute) or continue over a long period (chronic). Sinusitis that lasts for more than 12 weeks is considered chronic.  CAUSES  Causes of sinusitis include: Allergies. Structural abnormalities, such as displacement of the cartilage that separates your nostrils (deviated septum), which can decrease the air flow through your nose and sinuses and affect sinus drainage. Functional abnormalities, such as when the small hairs (cilia) that line your sinuses and help remove mucus do not work properly or are not present. SYMPTOMS  Symptoms of acute and chronic sinusitis are the same. The primary symptoms are pain and pressure around the affected sinuses. Other symptoms include: Upper toothache. Earache. Headache. Bad breath. Decreased sense of smell and taste. A cough, which worsens when you are lying flat. Fatigue. Fever. Thick drainage from your nose, which often is green and may contain pus (purulent). Swelling and warmth over  the affected sinuses. DIAGNOSIS  Your caregiver will perform a physical exam. During the exam, your caregiver may: Look in your nose for signs of abnormal growths in your nostrils (nasal polyps). Tap over the affected sinus to check for signs of infection. View the inside of your sinuses (endoscopy) with a  special imaging device with a light attached (endoscope), which is inserted into your sinuses. If your caregiver suspects that you have chronic sinusitis, one or more of the following tests may be recommended: Allergy tests. Nasal culture A sample of mucus is taken from your nose and sent to a lab and screened for bacteria. Nasal cytology A sample of mucus is taken from your nose and examined by your caregiver to determine if your sinusitis is related to an allergy. TREATMENT  Most cases of acute sinusitis are related to a viral infection and will resolve on their own within 10 days. Sometimes medicines are prescribed to help relieve symptoms (pain medicine, decongestants, nasal steroid sprays, or saline sprays).  However, for sinusitis related to a bacterial infection, your caregiver will prescribe antibiotic medicines. These are medicines that will help kill the bacteria causing the infection.  Rarely, sinusitis is caused by a fungal infection. In theses cases, your caregiver will prescribe antifungal medicine. For some cases of chronic sinusitis, surgery is needed. Generally, these are cases in which sinusitis recurs more than 3 times per year, despite other treatments. HOME CARE INSTRUCTIONS  Drink plenty of water. Water helps thin the mucus so your sinuses can drain more easily. Use a humidifier. Inhale steam 3 to 4 times a day (for example, sit in the bathroom with the shower running). Apply a warm, moist washcloth to your face 3 to 4 times a day, or as directed by your caregiver. Use saline nasal sprays to help moisten and clean your sinuses. Take over-the-counter or prescription medicines for pain, discomfort, or fever only as directed by your caregiver. SEEK IMMEDIATE MEDICAL CARE IF: You have increasing pain or severe headaches. You have nausea, vomiting, or drowsiness. You have swelling around your face. You have vision problems. You have a stiff neck. You have difficulty  breathing. MAKE SURE YOU:  Understand these instructions. Will watch your condition. Will get help right away if you are not doing well or get worse. Document Released: 03/10/2005 Document Revised: 06/02/2011 Document Reviewed: 03/25/2011 [facility] Patient Information 2014 [place], [place].    If you have been instructed to have an in-person evaluation today at a local Urgent Care facility, please use the link below. It will take you to a list of all of our available [facility], including address, phone number and hours of operation. Please do not delay care.  [facility]  If you or a family member do not have a primary care provider, use the link below to schedule a visit and establish care. When you choose a [facility] primary care physician or advanced practice provider, you gain a long-term partner in health. Find a Primary Care Provider  Learn more about [facility] in-office and virtual care options: [facility]

## 2022-04-14 NOTE — Progress Notes (Signed)
Virtual Visit Consent   Daniel Golden, you are scheduled for a virtual visit with a [facility] provider today. Just as with appointments in the office, your consent must be obtained to participate. Your consent will be active for this visit and any virtual visit you may have with one of our providers in the next 365 days. If you have a MyChart account, a copy of this consent can be sent to you electronically.  As this is a virtual visit, video technology does not allow for your provider to perform a traditional examination. This may limit your provider's ability to fully assess your condition. If your provider identifies any concerns that need to be evaluated in person or the need to arrange testing (such as labs, EKG, etc.), we will make arrangements to do so. Although advances in technology are sophisticated, we cannot ensure that it will always work on either your end or our end. If the connection with a video visit is poor, the visit may have to be switched to a telephone visit. With either a video or telephone visit, we are not always able to ensure that we have a secure connection.  By engaging in this virtual visit, you consent to the provision of healthcare and authorize for your insurance to be billed (if applicable) for the services provided during this visit. Depending on your insurance coverage, you may receive a charge related to this service.  I need to obtain your verbal consent now. Are you willing to proceed with your visit today? LOUDON KRAKOW has provided verbal consent on 04/14/2022 for a virtual visit (video or telephone). Daniel Golden, [place]  Date: 04/14/2022 4:08 PM  Virtual Visit via Video Note   I, Daniel Golden, connected with  Daniel Golden  ([id number], 06/05/1990) on 04/14/22 at  4:00 PM EST by a video-enabled telemedicine application and verified that I am speaking with the correct person using two identifiers.  Location: Patient: Virtual Visit  Location Patient: Home Provider: Virtual Visit Location Provider: Home Office   I discussed the limitations of evaluation and management by telemedicine and the availability of in person appointments. The patient expressed understanding and agreed to proceed.    History of Present Illness: Daniel Golden is a 32 y.o. who identifies as a male who was assigned male at birth, and is being seen today for concern of sinus infection. Notes initially started several days ago with head congestion, cough, fever and aches, almost a flu-like illness. Fever and aches have resolved. No substantial chest congestion. Head congestion has progressed now changing from clear to thick yellow and occasionally blood-tinged nasal discharge. Some sinus pain. No upper tooth pain. Denies chest pain or SOB. Denies recent travel or known sick contact. Is taking Tylenol Cold and Flu OTC which helps somewhat.  HPI: HPI  Problems:  Patient Active Problem List   Diagnosis Date Noted   Chest pain of uncertain etiology    SVT (supraventricular tachycardia)    Abnormal EKG    Palpitations 10/28/2018    Allergies: No Known Allergies Medications:  Current Outpatient Medications:    amoxicillin-clavulanate (AUGMENTIN) 875-125 MG tablet, Take 1 tablet by mouth 2 (two) times daily., Disp: 14 tablet, Rfl: 0   fluticasone (FLONASE) 50 MCG/ACT nasal spray, Place 2 sprays into both nostrils daily., Disp: 16 g, Rfl: 0   acetaminophen (TYLENOL) 500 MG tablet, Take 500 mg by mouth every 6 (six) hours as needed for moderate pain or headache., Disp: ,  Rfl:    diclofenac (VOLTAREN) 75 MG EC tablet, Take 1 tablet (75 mg total) by mouth 2 (two) times daily., Disp: 60 tablet, Rfl: 3   diltiazem (CARDIZEM) 30 MG tablet, TAKE 1 TABLET (30 MG TOTAL) BY MOUTH 4 (FOUR) TIMES DAILY AS NEEDED (FOR ELEVATED HEART RATES)., Disp: 90 tablet, Rfl: 1   fexofenadine-pseudoephedrine (ALLEGRA-D 24) 180-240 MG 24 hr tablet, Take 1 tablet by mouth daily.,  Disp: , Rfl:    metoprolol succinate (TOPROL-XL) 50 MG 24 hr tablet, TAKE 1 TABLET BY MOUTH DAILY. TAKE WITH OR IMMEDIATELY FOLLOWING A MEAL., Disp: 90 tablet, Rfl: 1  Observations/Objective: Patient is well-developed, well-nourished in no acute distress.  Resting comfortably at home.  Head is normocephalic, atraumatic.  No labored breathing. Speech is clear and coherent with logical content.  Patient is alert and oriented at baseline.   Assessment and Plan: 1. Acute bacterial sinusitis - fluticasone (FLONASE) 50 MCG/ACT nasal spray; Place 2 sprays into both nostrils daily.  Dispense: 16 g; Refill: 0 - amoxicillin-clavulanate (AUGMENTIN) 875-125 MG tablet; Take 1 tablet by mouth 2 (two) times daily.  Dispense: 14 tablet; Refill: 0  Will have him COVID test to be cautious but seems more like he had a flu-like illness that his body has handled but developed a secondary sinusitis. Rx Augmentin.  Increase fluids.  Rest.  Saline nasal spray.  Probiotic.  Mucinex as directed.  Humidifier in bedroom. Flonase per orders.  Call or return to clinic if symptoms are not improving.   Follow Up Instructions: I discussed the assessment and treatment plan with the patient. The patient was provided an opportunity to ask questions and all were answered. The patient agreed with the plan and demonstrated an understanding of the instructions.  A copy of instructions were sent to the patient via MyChart unless otherwise noted below.   The patient was advised to call back or seek an in-person evaluation if the symptoms worsen or if the condition fails to improve as anticipated.  Time:  I spent 10 minutes with the patient via telehealth technology discussing the above problems/concerns.    Daniel Rio, PA-C

## 2022-06-05 DIAGNOSIS — H25811 Combined forms of age-related cataract, right eye: Secondary | ICD-10-CM | POA: Diagnosis not present

## 2022-06-05 DIAGNOSIS — H52202 Unspecified astigmatism, left eye: Secondary | ICD-10-CM | POA: Diagnosis not present

## 2022-06-05 DIAGNOSIS — H5211 Myopia, right eye: Secondary | ICD-10-CM | POA: Diagnosis not present

## 2022-06-05 DIAGNOSIS — Z961 Presence of intraocular lens: Secondary | ICD-10-CM | POA: Diagnosis not present

## 2022-07-08 NOTE — Progress Notes (Unsigned)
  Electrophysiology Office Note:   Date:  07/09/2022  ID:  Daniel Golden, DOB [birth year], MRN [id number]  Primary Cardiologist: None Electrophysiologist: Will Jorja Loa, MD   History of Present Illness:   Daniel Golden is a 32 y.o. male with h/o SVT, palpitations, and chest pain seen today for routine electrophysiology followup. Since last being seen in our clinic the patient reports doing very well.  he denies chest pain, palpitations, dyspnea, PND, orthopnea, nausea, vomiting, dizziness, syncope, edema, weight gain, or early satiety.   Review of systems complete and found to be negative unless listed in HPI.   Studies Reviewed:    EKG is not ordered today   Risk Assessment/Calculations:            Physical Exam:   VS:  BP 112/76   Pulse 61   Ht  (1.753 m)   Wt 183 lb 6.4 oz (83.2 kg)   SpO2 97%   BMI 27.08 kg/m    Wt Readings from Last 3 Encounters:  07/09/22 183 lb 6.4 oz (83.2 kg)  12/24/21 181 lb (82.1 kg)  08/22/21 173 lb (78.5 kg)     GEN: Well nourished, well developed in no acute distress NECK: No JVD; No carotid bruits CARDIAC: Regular rate and rhythm, no murmurs, rubs, gallops RESPIRATORY:  Clear to auscultation without rales, wheezing or rhonchi  ABDOMEN: Soft, non-tender, non-distended EXTREMITIES:  No edema; No deformity   ASSESSMENT AND PLAN:    SVT Stable on toprol Has not needed to use prn diltiazem  ? Spasm / atypical chest pain Prior LHC with no CAD No current symptoms Avoid stimulants and supplements  Follow up with Dr. Elberta Fortis in 12 months  Signed, Graciella Freer, PA-C

## 2022-07-09 ENCOUNTER — Ambulatory Visit: Payer: BC Managed Care – PPO | Attending: Student | Admitting: Student

## 2022-07-09 ENCOUNTER — Encounter: Payer: Self-pay | Admitting: Student

## 2022-07-09 VITALS — BP 112/76 | HR 61 | Ht 69.0 in | Wt 183.4 lb

## 2022-07-09 DIAGNOSIS — R9431 Abnormal electrocardiogram [ECG] [EKG]: Secondary | ICD-10-CM

## 2022-07-09 DIAGNOSIS — R0789 Other chest pain: Secondary | ICD-10-CM

## 2022-07-09 DIAGNOSIS — I471 Supraventricular tachycardia, unspecified: Secondary | ICD-10-CM

## 2022-07-09 NOTE — Patient Instructions (Signed)
Medication Instructions:  Your physician recommends that you continue on your current medications as directed. Please refer to the Current Medication list given to you today.  *If you need a refill on your cardiac medications before your next appointment, please call your pharmacy*  Lab Work: None ordered If you have labs (blood work) drawn today and your tests are completely normal, you will receive your results only by: MyChart Message (if you have MyChart) OR A paper copy in the mail If you have any lab test that is abnormal or we need to change your treatment, we will call you to review the results.  Follow-Up: At [facility], you and your health needs are our priority.  As part of our continuing mission to provide you with exceptional heart care, we have created designated Provider Care Teams.  These Care Teams include your primary Cardiologist (physician) and Advanced Practice Providers (APPs -  Physician Assistants and Nurse Practitioners) who all work together to provide you with the care you need, when you need it.  Your next appointment:   1 year(s)  Provider:   Will Camnitz, MD  

## 2022-08-30 ENCOUNTER — Other Ambulatory Visit: Payer: Self-pay | Admitting: Physician Assistant

## 2022-10-28 ENCOUNTER — Telehealth: Payer: Self-pay | Admitting: Podiatry

## 2022-10-28 ENCOUNTER — Other Ambulatory Visit: Payer: Self-pay | Admitting: Podiatry

## 2022-10-28 MED ORDER — DICLOFENAC SODIUM 75 MG PO TBEC: 75.0000 mg | DELAYED_RELEASE_TABLET | Freq: Two times a day (BID) | ORAL | 3 refills | Status: AC

## 2022-10-28 NOTE — Telephone Encounter (Signed)
pt would like refills on this  diclofenac diclofenac (VOLTAREN) 75 MG EC tablet

## 2022-12-30 DIAGNOSIS — Z23 Encounter for immunization: Secondary | ICD-10-CM | POA: Diagnosis not present

## 2023-04-16 ENCOUNTER — Ambulatory Visit (INDEPENDENT_AMBULATORY_CARE_PROVIDER_SITE_OTHER): Payer: BC Managed Care – PPO | Admitting: Primary Care

## 2023-05-13 ENCOUNTER — Ambulatory Visit (INDEPENDENT_AMBULATORY_CARE_PROVIDER_SITE_OTHER): Payer: BC Managed Care – PPO | Admitting: Primary Care

## 2023-06-19 ENCOUNTER — Ambulatory Visit: Payer: Self-pay | Admitting: *Deleted

## 2023-06-19 ENCOUNTER — Encounter

## 2023-06-19 NOTE — Telephone Encounter (Signed)
  Chief Complaint: constant coughing, "feels like something stuck in throat". No pain  Symptoms: coughing getting worse, productive at times clear. "Feels like something stuck in throat". Denies pain can swallow food and liquids . Has been seen by ENT before for same issue Frequency: yesterday  Pertinent Negatives: Patient denies chest pain no difficulty breathing no difficulty swallowing, no redness or swelling reported in throat  Disposition: [x] ED /[] Urgent Care (no appt availability in office) / [] Appointment(In office/virtual)/ []  [facility]/ [] Home Care/ [] Refused Recommended Disposition /[] [facility]/ []  Follow-up with PCP Additional Notes:   No PCP listed . Recommended ED due to uncertain if something stuck in throat and coughing worsening. Scheduled patient new patient appt at [facility] 08/10/23.          Reason for Disposition  Patient sounds very sick or weak to the triager  Answer Assessment - Initial Assessment Questions 1. ONSET: "When did the cough begin?"      Ongoing but getting worse "feels like something stuck in throat" 2. SEVERITY: "How bad is the cough today?"      Not severe but constant cough to clear throat 3. SPUTUM: "Describe the color of your sputum" (none, dry cough; clear, white, yellow, green)     Clear  4. HEMOPTYSIS: "Are you coughing up any blood?" If so ask: "How much?" (flecks, streaks, tablespoons, etc.)     na 5. DIFFICULTY BREATHING: "Are you having difficulty breathing?" If Yes, ask: "How bad is it?" (e.g., mild, moderate, severe)    - MILD: No SOB at rest, mild SOB with walking, speaks normally in sentences, can lie down, no retractions, pulse < 100.    - MODERATE: SOB at rest, SOB with minimal exertion and prefers to sit, cannot lie down flat, speaks in phrases, mild retractions, audible wheezing, pulse 100-120.    - SEVERE: Very SOB at rest, speaks in single words, struggling to breathe, sitting hunched forward, retractions,  pulse > 120      Denies  6. FEVER: "Do you have a fever?" If Yes, ask: "What is your temperature, how was it measured, and when did it start?"     na 7. CARDIAC HISTORY: "Do you have any history of heart disease?" (e.g., heart attack, congestive heart failure)      na 8. LUNG HISTORY: "Do you have any history of lung disease?"  (e.g., pulmonary embolus, asthma, emphysema)     na 9. PE RISK FACTORS: "Do you have a history of blood clots?" (or: recent major surgery, recent prolonged travel, bedridden)     na 10. OTHER SYMPTOMS: "Do you have any other symptoms?" (e.g., runny nose, wheezing, chest pain)       Constant coughing  11. PREGNANCY: "Is there any chance you are pregnant?" "When was your last menstrual period?"       na 12. TRAVEL: "Have you traveled out of the country in the last month?" (e.g., travel history, exposures)       na  Protocols used: Cough - Acute Productive-A-AH

## 2023-06-20 ENCOUNTER — Ambulatory Visit
Admission: RE | Admit: 2023-06-20 | Discharge: 2023-06-20 | Disposition: A | Discharge status: Home / Self Care | Source: Ambulatory Visit | Attending: Internal Medicine | Admitting: Internal Medicine

## 2023-06-20 VITALS — BP 112/69 | HR 74 | Temp 98.2°F | Resp 18 | Ht 69.0 in | Wt 170.0 lb

## 2023-06-20 DIAGNOSIS — J309 Allergic rhinitis, unspecified: Secondary | ICD-10-CM

## 2023-06-20 MED ORDER — PREDNISONE 20 MG PO TABS: 40.0000 mg | ORAL_TABLET | Freq: Every day | ORAL | 0 refills | Status: AC

## 2023-06-20 NOTE — Discharge Instructions (Addendum)
 Symptoms are most consistent with inflamed sinuses and congestion causing drainage to the posterior throat.  This does not require antibiotic treatment.  Reassuringly her vital signs are good.  We will treat the symptoms with the following: Prednisone 40 mg (2 tablets) once daily for 5 days. Take this in the morning.  This is a steroid to help with inflammation and pain.  This may help to relieve your symptoms but is not a long-term cure for allergies Continue to use Allegra daily Recommend contacting an allergy specialist to schedule an appointment to discuss the chronic and severe allergy symptoms that occur Return to urgent care or PCP if symptoms worsen or fail to resolve.

## 2023-06-20 NOTE — ED Provider Notes (Signed)
 [facility]    CSN: [id number] Arrival date & time: 06/20/23  1106      History   Chief Complaint Chief Complaint  Patient presents with   Sore Throat   Nasal Congestion    HPI Daniel Golden is a 33 y.o. male.   33 year old male who presents urgent care with complaints of sore throat.  He reports that this has been going on for several days.  He has no fevers, chills, cough, shortness of breath, nausea, vomiting, ear pain, difficulty swallowing.  He has ongoing congestion with sinus drainage that has been going on for years.  He saw an ear nose and throat specialist several years ago but they told him that there was nothing wrong.  He has not seen an allergist.  He does take Allegra daily.  He has tried [employer] but did not feel that this helping much.   Sore Throat Pertinent negatives include no chest pain, no abdominal pain and no shortness of breath.    Past Medical History:  Diagnosis Date   Headache     Patient Active Problem List   Diagnosis Date Noted   Chest pain of uncertain etiology    SVT (supraventricular tachycardia) (HCC)    Abnormal EKG    Palpitations 10/28/2018    Past Surgical History:  Procedure Laterality Date   EYE SURGERY     HERNIA REPAIR     LEFT HEART CATH AND CORONARY ANGIOGRAPHY N/A 10/29/2018   Procedure: LEFT HEART CATH AND CORONARY ANGIOGRAPHY;  Surgeon: Yvonne Kendall, MD;  Location: [facility];  Service: Cardiovascular;  Laterality: N/A;   SVT ABLATION N/A 12/24/2018   Procedure: SVT ABLATION;  Surgeon: Regan Lemming, MD;  Location: [facility];  Service: Cardiovascular;  Laterality: N/A;       Home Medications    Prior to Admission medications   Medication Sig Start Date End Date Taking? Authorizing Provider  diclofenac (VOLTAREN) 75 MG EC tablet Take 1 tablet (75 mg total) by mouth 2 (two) times daily. 10/28/22  Yes Felecia Shelling, DPM  metoprolol succinate (TOPROL-XL) 50 MG 24 hr tablet TAKE  1 TABLET BY MOUTH EVERY DAY WITH OR IMMEDIATELY FOLLOWING A MEAL 09/01/22  Yes Graciella Freer, PA-C  predniSONE (DELTASONE) 20 MG tablet Take 2 tablets (40 mg total) by mouth daily with breakfast for 5 days. 06/20/23 06/25/23 Yes Kyaira Trantham A, PA-C  acetaminophen (TYLENOL) 500 MG tablet Take 500 mg by mouth every 6 (six) hours as needed for moderate pain or headache.    [provider]  amoxicillin-clavulanate (AUGMENTIN) 875-125 MG tablet Take 1 tablet by mouth 2 (two) times daily. Patient not taking: Reported on 07/09/2022 04/14/22   Waldon Merl, PA-C  diltiazem (CARDIZEM) 30 MG tablet TAKE 1 TABLET (30 MG TOTAL) BY MOUTH 4 (FOUR) TIMES DAILY AS NEEDED (FOR ELEVATED HEART RATES). 07/24/20   Camnitz, Will Daphine Deutscher, MD  fexofenadine-pseudoephedrine (ALLEGRA-D 24) 180-240 MG 24 hr tablet Take 1 tablet by mouth daily.    [provider]  fluticasone (FLONASE) 50 MCG/ACT nasal spray Place 2 sprays into both nostrils daily. 04/14/22   Waldon Merl, PA-C  famotidine (PEPCID) 20 MG tablet Take 1 tablet (20 mg total) by mouth 2 (two) times daily. Patient taking differently: Take 20 mg by mouth 2 (two) times daily as needed for heartburn. 10/25/18 07/30/19  Eyvonne Mechanic, PA-C    Family History Family History  Problem Relation Age of Onset  Diabetes Mother    Heart failure Mother    Diabetes Father    Heart failure Father     Social History Social History   Tobacco Use   Smoking status: Never    Passive exposure: Never   Smokeless tobacco: Never  Vaping Use   Vaping status: Never Used  Substance Use Topics   Alcohol use: Yes    Comment: Occassionally.   Drug use: Never     Allergies   Patient has no known allergies.   Review of Systems Review of Systems  Constitutional:  Negative for chills and fever.  HENT:  Positive for congestion and sore throat. Negative for ear pain.   Eyes:  Negative for pain and visual disturbance.  Respiratory:  Negative  for cough and shortness of breath.   Cardiovascular:  Negative for chest pain and palpitations.  Gastrointestinal:  Negative for abdominal pain and vomiting.  Genitourinary:  Negative for dysuria and hematuria.  Musculoskeletal:  Negative for arthralgias and back pain.  Skin:  Negative for color change and rash.  Neurological:  Negative for seizures and syncope.  All other systems reviewed and are negative.    Physical Exam Triage Vital Signs ED Triage Vitals  Encounter Vitals Group     BP 06/20/23 1117 112/69     Systolic BP Percentile --      Diastolic BP Percentile --      Pulse Rate 06/20/23 1117 74     Resp 06/20/23 1117 18     Temp 06/20/23 1117 98.2 F (36.8 C)     Temp Source 06/20/23 1117 Oral     SpO2 06/20/23 1117 96 %     Weight 06/20/23 1115 170 lb (77.1 kg)     Height 06/20/23 1115 5\' 9"  (1.753 m)     Head Circumference --      Peak Flow --      Pain Score 06/20/23 1113 0     Pain Loc --      Pain Education --      Exclude from Growth Chart --    No data found.  Updated Vital Signs BP 112/69 (BP Location: Right Arm)   Pulse 74   Temp 98.2 F (36.8 C) (Oral)   Resp 18   Ht 5\' 9"  (1.753 m)   Wt 170 lb (77.1 kg)   SpO2 96%   BMI 25.10 kg/m   Visual Acuity Right Eye Distance:   Left Eye Distance:   Bilateral Distance:    Right Eye Near:   Left Eye Near:    Bilateral Near:     Physical Exam Vitals and nursing note reviewed.  Constitutional:      General: He is not in acute distress.    Appearance: He is well-developed.  HENT:     Head: Normocephalic and atraumatic.     Right Ear: Tympanic membrane normal.     Left Ear: Tympanic membrane normal.     Nose: Congestion present.     Mouth/Throat:     Mouth: Mucous membranes are moist.     Pharynx: Posterior oropharyngeal erythema (Minimal) present. No pharyngeal swelling or oropharyngeal exudate.  Eyes:     Conjunctiva/sclera: Conjunctivae normal.  Cardiovascular:     Rate and Rhythm: Normal  rate and regular rhythm.     Heart sounds: No murmur heard. Pulmonary:     Effort: Pulmonary effort is normal. No respiratory distress.     Breath sounds: Normal breath sounds.  Abdominal:  Palpations: Abdomen is soft.     Tenderness: There is no abdominal tenderness.  Musculoskeletal:        General: No swelling.     Cervical back: Neck supple.  Skin:    General: Skin is warm and dry.     Capillary Refill: Capillary refill takes less than 2 seconds.  Neurological:     Mental Status: He is alert.  Psychiatric:        Mood and Affect: Mood normal.      UC Treatments / Results  Labs (all labs ordered are listed, but only abnormal results are displayed) Labs Reviewed - No data to display  EKG   Radiology No results found.  Procedures Procedures (including critical care time)  Medications Ordered in UC Medications - No data to display  Initial Impression / Assessment and Plan / UC Course  I have reviewed the triage vital signs and the nursing notes.  Pertinent labs & imaging results that were available during my care of the patient were reviewed by me and considered in my medical decision making (see chart for details).     Nasal sinus inflammation due to allergy   Symptoms are most consistent with inflamed sinuses and congestion causing drainage to the posterior throat.  This does not require antibiotic treatment.  Reassuringly her vital signs are good.  We will treat the symptoms with the following: Prednisone 40 mg (2 tablets) once daily for 5 days. Take this in the morning.  This is a steroid to help with inflammation and pain.  This may help to relieve your symptoms but is not a long-term cure for allergies Continue to use Allegra daily Recommend contacting an allergy specialist to schedule an appointment to discuss the chronic and severe allergy symptoms that occur Return to urgent care or PCP if symptoms worsen or fail to resolve.    Final Clinical  Impressions(s) / UC Diagnoses   Final diagnoses:  Nasal sinus inflammation due to allergy     Discharge Instructions      Symptoms are most consistent with inflamed sinuses and congestion causing drainage to the posterior throat.  This does not require antibiotic treatment.  Reassuringly her vital signs are good.  We will treat the symptoms with the following: Prednisone 40 mg (2 tablets) once daily for 5 days. Take this in the morning.  This is a steroid to help with inflammation and pain.  This may help to relieve your symptoms but is not a long-term cure for allergies Continue to use Allegra daily Recommend contacting an allergy specialist to schedule an appointment to discuss the chronic and severe allergy symptoms that occur Return to urgent care or PCP if symptoms worsen or fail to resolve.     ED Prescriptions     Medication Sig Dispense Auth. Provider   predniSONE (DELTASONE) 20 MG tablet Take 2 tablets (40 mg total) by mouth daily with breakfast for 5 days. 10 tablet Landis Martins, [place]      PDMP not reviewed this encounter.   Landis Martins, [place] 06/20/23 1157

## 2023-06-20 NOTE — ED Triage Notes (Signed)
"  This started about 2 days ago with sore throat, hoarse voice and now stuffy nose". History of allergies. No fever. No cough.

## 2023-07-06 NOTE — Progress Notes (Unsigned)
  Electrophysiology Office Note:   Date:  07/07/2023  ID:  Daniel Golden, DOB 12/13/1990, MRN [id number]  Primary Cardiologist: None Primary Heart Failure: None Electrophysiologist: Jeraline Marcinek Cortland Ding, MD      History of Present Illness:   Daniel Golden is a 33 y.o. male with h/o SVT seen today for routine electrophysiology followup.   Since last being seen in our clinic the patient reports doing well.  He has noted no further episodes of SVT since being on metoprolol.  He is happy with his control.  For now, he Rodrigues Urbanek continue with his current management.  He is inquiring about the possibility of stopping medications in the future.  he denies chest pain, palpitations, dyspnea, PND, orthopnea, nausea, vomiting, dizziness, syncope, edema, weight gain, or early satiety.   Review of systems complete and found to be negative unless listed in HPI.   EP Information / Studies Reviewed:    EKG is ordered today. Personal review as below.  EKG Interpretation Date/Time:  Tuesday July 07 2023 08:56:10 EDT Ventricular Rate:  49 PR Interval:  120 QRS Duration:  88 QT Interval:  400 QTC Calculation: 361 R Axis:   28  Text Interpretation: Sinus bradycardia Marked ST abnormality, possible inferior subendocardial injury When compared with ECG of 24-Dec-2018 13:18, Left anterior fascicular block is no longer Present ST depression has replaced ST elevation in Inferior leads T wave inversion now evident in Inferior leads Confirmed by Terree Gaultney ([id number]) on 07/07/2023 9:00:41 AM    Risk Assessment/Calculations:           Physical Exam:   VS:  BP 110/60 (BP Location: Left Arm, Patient Position: Sitting, Cuff Size: Normal)   Pulse (!) 48   Ht 5\' 9"  (1.753 m)   Wt 161 lb (73 kg)   SpO2 97%   BMI 23.78 kg/m    Wt Readings from Last 3 Encounters:  07/07/23 161 lb (73 kg)  06/20/23 170 lb (77.1 kg)  07/09/22 183 lb 6.4 oz (83.2 kg)     GEN: Well nourished, well developed in no acute  distress NECK: No JVD; No carotid bruits CARDIAC: Regular rate and rhythm, no murmurs, rubs, gallops RESPIRATORY:  Clear to auscultation without rales, wheezing or rhonchi  ABDOMEN: Soft, non-tender, non-distended EXTREMITIES:  No edema; No deformity   ASSESSMENT AND PLAN:     1. SVT: Has been doing well without episodes of SVT.  Tolerating metoprolol without issue.  Delayla Hoffmaster continue metoprolol and as needed diltiazem.  If he worsens, metoprolol and use as needed diltiazem alone, that would be reasonable.  Follow up with EP APP in 12 months  Signed, Lurlene Ronda Cortland Ding, MD

## 2023-07-07 ENCOUNTER — Encounter: Payer: Self-pay | Admitting: Cardiology

## 2023-07-07 ENCOUNTER — Ambulatory Visit: Payer: BC Managed Care – PPO | Attending: Cardiology | Admitting: Cardiology

## 2023-07-07 VITALS — BP 110/60 | HR 48 | Ht 69.0 in | Wt 161.0 lb

## 2023-07-07 DIAGNOSIS — I471 Supraventricular tachycardia, unspecified: Secondary | ICD-10-CM

## 2023-07-07 MED ORDER — DILTIAZEM HCL 30 MG PO TABS: 30.0000 mg | ORAL_TABLET | Freq: Four times a day (QID) | ORAL | 1 refills | Status: AC | PRN

## 2023-07-07 MED ORDER — METOPROLOL SUCCINATE ER 50 MG PO TB24: 50.0000 mg | ORAL_TABLET | Freq: Every day | ORAL | 3 refills | Status: AC

## 2023-07-07 NOTE — Patient Instructions (Signed)
 Medication Instructions:  Your physician recommends that you continue on your current medications as directed. Please refer to the Current Medication list given to you today.  *If you need a refill on your cardiac medications before your next appointment, please call your pharmacy*  Lab Work: None ordered.  If you have labs (blood work) drawn today and your tests are completely normal, you will receive your results only by: MyChart Message (if you have MyChart) OR A paper copy in the mail If you have any lab test that is abnormal or we need to change your treatment, we will call you to review the results.  Testing/Procedures: None ordered.   Follow-Up: At [facility], you and your health needs are our priority.  As part of our continuing mission to provide you with exceptional heart care, our providers are all part of one team.  This team includes your primary Cardiologist (physician) and Advanced Practice Providers or APPs (Physician Assistants and Nurse Practitioners) who all work together to provide you with the care you need, when you need it.  Your next appointment:    12 months with Dr Elberta Fortis PA      1st Floor: - Lobby - Registration  - Pharmacy  - Lab - Cafe  2nd Floor: - PV Lab - Diagnostic Testing (echo, CT, nuclear med)  3rd Floor: - Vacant  4th Floor: - TCTS (cardiothoracic surgery) - AFib Clinic - Structural Heart Clinic - Vascular Surgery  - Vascular Ultrasound  5th Floor: - HeartCare Cardiology (general and EP) - Clinical Pharmacy for coumadin, hypertension, lipid, weight-loss medications, and med management appointments    Valet parking services will be available as well.

## 2023-08-03 ENCOUNTER — Encounter (HOSPITAL_COMMUNITY): Payer: Self-pay

## 2023-08-03 ENCOUNTER — Encounter: Payer: Self-pay | Admitting: Internal Medicine

## 2023-08-03 ENCOUNTER — Other Ambulatory Visit: Payer: Self-pay

## 2023-08-03 ENCOUNTER — Ambulatory Visit (INDEPENDENT_AMBULATORY_CARE_PROVIDER_SITE_OTHER): Admitting: Internal Medicine

## 2023-08-03 VITALS — BP 100/70 | HR 52 | Temp 97.8°F | Resp 16 | Ht 69.0 in | Wt 160.0 lb

## 2023-08-03 DIAGNOSIS — R1319 Other dysphagia: Secondary | ICD-10-CM | POA: Insufficient documentation

## 2023-08-03 DIAGNOSIS — J310 Chronic rhinitis: Secondary | ICD-10-CM | POA: Diagnosis not present

## 2023-08-03 MED ORDER — FLUTICASONE PROPIONATE 50 MCG/ACT NA SUSP: 2.0000 | Freq: Every day | NASAL | 5 refills | Status: AC

## 2023-08-03 NOTE — Patient Instructions (Signed)
 Chronic Rhinitis Chronic rhinitis with sinus congestion and clear rhinorrhea. Allegra provides partial relief. No ocular symptoms, eczema, or medication/food allergies. Flonase  nasal spray suggested to enhance symptom control. - Continue Allegra 180 mg daily as needed. - Add Flonase  nasal spray. 1 spray in each nostril twice daily. - Schedule allergy testing for environmental allergens. Must stop Allegra 3 days prior to testing.  Gastrointestinal issue Chronic sensation of food impaction in the throat with mucus production and postprandial coughing since 2021. Previous ENT evaluation unremarkable. Has not seen GI. Gastroenterology evaluation recommended. - Refer to gastroenterologist.      Follow up : for allergy testing, stop allegra 3 days prior to visit.  It was a pleasure meeting you in clinic today! Thank you for allowing me to participate in your care.  Jonathon Neighbors, MD [facility]

## 2023-08-03 NOTE — Progress Notes (Signed)
 NEW PATIENT Date of Service/Encounter:  08/03/23 Referring provider: none-self referred Primary care provider: Patient, No Pcp Per  Subjective:  Daniel Golden is a 33 y.o. male with a PMHx of SVT on metoprolol  presenting today for evaluation of chronic rhinitis, throat complaints, trouble swallowing. History obtained from: chart review and patient.   Discussed the use of AI scribe software for clinical note transcription with the patient, who gave verbal consent to proceed.  History of Present Illness   Daniel Golden is a 33 year old male who presents with throat issues and mucus production after eating.  He has experienced throat issues since 2021, particularly a sensation of something being caught in his throat when eating certain foods. This is often accompanied by coughing and excessive mucus production, which he finds bothersome and abnormal.  He has previously consulted with an ear, nose, and throat specialist who performed a scope without finding any abnormalities. He has not yet seen a gastroenterologist for further evaluation.  He has tried various allergy medications including Claritin, Zyrtec, and Xyzal, which were ineffective. Allegra has provided some relief for his sinuses and throat, though it only helps to a limited extent. He has also tried omeprazole for potential acid reflux but could not determine its effectiveness.  He experiences a lot of mucus production, particularly after eating, and describes it as clear rather than yellowish, which he associates with his allergies. He has used Flonase  nasal spray in the past as an add-on therapy for his allergies. He is not currently using.  No history of eczema, asthma, medication allergies, or food allergies. No itchy eyes or any eye-related symptoms.      Chart Review:  Reviewed UC notes from  06/20/23: seen for nasal inflammation, tx: prednisone , allegra, recommended to see allergy specialist. 07/07/23: OV Cards for  SVT, plan to stop metoprolol  and use diltiazem  PRN   Past Medical History: Past Medical History:  Diagnosis Date   Headache    Medication List:  Current Outpatient Medications  Medication Sig Dispense Refill   acetaminophen  (TYLENOL ) 500 MG tablet Take 500 mg by mouth every 6 (six) hours as needed for moderate pain or headache.     diclofenac  (VOLTAREN ) 75 MG EC tablet Take 1 tablet (75 mg total) by mouth 2 (two) times daily. 60 tablet 3   diltiazem  (CARDIZEM ) 30 MG tablet Take 1 tablet (30 mg total) by mouth 4 (four) times daily as needed (for elevated heart rates). 90 tablet 1   fexofenadine-pseudoephedrine (ALLEGRA-D 24) 180-240 MG 24 hr tablet Take 1 tablet by mouth daily.     fluticasone  (FLONASE ) 50 MCG/ACT nasal spray Place 2 sprays into both nostrils daily. 16 g 0   metoprolol  succinate (TOPROL -XL) 50 MG 24 hr tablet Take 1 tablet (50 mg total) by mouth daily. Take with or immediately following a meal. 90 tablet 3   No current facility-administered medications for this visit.   Known Allergies:  No Known Allergies Past Surgical History: Past Surgical History:  Procedure Laterality Date   EYE SURGERY     HERNIA REPAIR     LEFT HEART CATH AND CORONARY ANGIOGRAPHY N/A 10/29/2018   Procedure: LEFT HEART CATH AND CORONARY ANGIOGRAPHY;  Surgeon: Sammy Crisp, MD;  Location: [facility] INVASIVE CV LAB;  Service: Cardiovascular;  Laterality: N/A;   SVT ABLATION N/A 12/24/2018   Procedure: SVT ABLATION;  Surgeon: Lei Pump, MD;  Location: [facility] INVASIVE CV LAB;  Service: Cardiovascular;  Laterality: N/A;   Family History:  Family History  Problem Relation Age of Onset   Diabetes Mother    Heart failure Mother    Diabetes Father    Heart failure Father    Social History: Daniel Golden lives in a house with no water damage, tile floors, electric heating, central AC, indoor dog, no roaches, using DM covers on bed and pillows, works in [employer], no HEPA filter near home,  [place]..   ROS:  All other systems negative except as noted per HPI.  Objective:  Blood pressure 100/70, pulse (!) 52, temperature 97.8 F (36.6 C), temperature source Temporal, resp. rate 16, height 5\' 9"  (1.753 m), weight 160 lb (72.6 kg), SpO2 99%. Body mass index is 23.63 kg/m. Physical Exam:  General Appearance:  Alert, cooperative, no distress, appears stated age  Head:  Normocephalic, without obvious abnormality, atraumatic  Eyes:  Conjunctiva clear, EOM's intact  Ears EACs normal bilaterally and normal TMs bilaterally  Nose: Nares normal, hypertrophic turbinates, normal mucosa, and no visible anterior polyps  Throat: Lips, tongue normal; teeth and gums normal, normal posterior oropharynx  Neck: Supple, symmetrical  Lungs:   clear to auscultation bilaterally, Respirations unlabored, no coughing  Heart:  regular rate and rhythm and no murmur, Appears well perfused  Extremities: No edema  Skin: Skin color, texture, turgor normal and no rashes or lesions on visualized portions of skin  Neurologic: No gross deficits   Diagnostics:  Labs:  Lab Orders  No laboratory test(s) ordered today     Assessment and Plan  Assessment and Plan Assessment & Plan Assessment and Plan    Chronic Rhinitis Chronic rhinitis with sinus congestion and clear rhinorrhea. Allegra provides partial relief. No ocular symptoms, eczema, or medication/food allergies. Flonase  nasal spray suggested to enhance symptom control. - Continue Allegra 180 mg daily as needed. - Add Flonase  nasal spray. 1 spray in each nostril twice daily. - Schedule allergy testing for environmental allergens. Must stop Allegra 3 days prior to testing.  Gastrointestinal issue-Dysphagia and food bolus Chronic sensation of food impaction in the throat with mucus production and postprandial coughing since 2021. Previous ENT evaluation unremarkable. Has not seen GI. Gastroenterology evaluation  recommended. - Refer to gastroenterologist.      Follow up : for allergy testing, stop allegra 3 days prior to visit.  It was a pleasure meeting you in clinic today! Thank you for allowing me to participate in your care.  Jonathon Neighbors, MD [facility]    This note in its entirety was forwarded to the Provider who requested this consultation.  Other: none  Thank you for your kind referral. I appreciate the opportunity to take part in Daniel Golden's care. Please do not hesitate to contact me with questions.  Sincerely,  Jonathon Neighbors, MD [facility] 

## 2023-08-04 ENCOUNTER — Telehealth (INDEPENDENT_AMBULATORY_CARE_PROVIDER_SITE_OTHER): Payer: Self-pay | Admitting: Primary Care

## 2023-08-04 NOTE — Telephone Encounter (Signed)
 Called pt to confirm appt.Pt did not answer and could not LVM.

## 2023-08-06 ENCOUNTER — Telehealth: Payer: Self-pay | Admitting: Internal Medicine

## 2023-08-06 NOTE — Telephone Encounter (Signed)
 Daniel Golden was internally referred to [name].  They have not reached out to him as of yet.  I did update the referral, some info was missing, so I am hoping they will reach out now.  I will follow up in a week,  If he isn't scheduled then, I will call  or refer him somewhere else.

## 2023-08-07 ENCOUNTER — Telehealth (INDEPENDENT_AMBULATORY_CARE_PROVIDER_SITE_OTHER): Payer: Self-pay | Admitting: Primary Care

## 2023-08-07 NOTE — Telephone Encounter (Signed)
 Called pt to confirm appt. Pt will be present.

## 2023-08-10 ENCOUNTER — Ambulatory Visit (INDEPENDENT_AMBULATORY_CARE_PROVIDER_SITE_OTHER): Admitting: Primary Care

## 2023-08-10 ENCOUNTER — Telehealth (INDEPENDENT_AMBULATORY_CARE_PROVIDER_SITE_OTHER): Payer: Self-pay | Admitting: Primary Care

## 2023-08-10 NOTE — Telephone Encounter (Signed)
 Called pt to reschedule missed appt. Pt did not answer and could not LVM.

## 2023-08-24 ENCOUNTER — Ambulatory Visit: Admitting: Internal Medicine

## 2023-08-24 DIAGNOSIS — J309 Allergic rhinitis, unspecified: Secondary | ICD-10-CM

## 2023-09-18 ENCOUNTER — Encounter: Payer: Self-pay | Admitting: Internal Medicine

## 2023-09-18 NOTE — Telephone Encounter (Signed)
 Thanks Nat for update.

## 2023-09-18 NOTE — Telephone Encounter (Signed)
 [facility] closed Daniel Golden's referral.  They have reached out to him several times and his mailbox was full.  They sent a MyChart message and mailed a letter to his home.  They have not heard from him.

## 2023-11-16 ENCOUNTER — Telehealth: Admitting: Family Medicine

## 2023-11-16 DIAGNOSIS — J069 Acute upper respiratory infection, unspecified: Secondary | ICD-10-CM | POA: Diagnosis not present

## 2023-11-16 MED ORDER — BENZONATATE 100 MG PO CAPS: 100.0000 mg | ORAL_CAPSULE | Freq: Three times a day (TID) | ORAL | 0 refills | Status: AC | PRN

## 2023-11-16 MED ORDER — PROMETHAZINE-DM 6.25-15 MG/5ML PO SYRP: 5.0000 mL | ORAL_SOLUTION | Freq: Four times a day (QID) | ORAL | 0 refills | Status: AC | PRN

## 2023-11-16 NOTE — Patient Instructions (Addendum)
 Daniel Golden, thank you for joining Chiquita [name], NP for today's virtual visit.  While this provider is not your primary care provider (PCP), if your PCP is located in our provider database this encounter information will be shared with them immediately following your visit.   A [facility] MyChart account gives you access to today's visit and all your visits, tests, and labs performed at [facility]  click here if you don't have a [facility] MyChart account or go to [email or web address]  Consent: (Patient) Daniel Golden provided verbal consent for this virtual visit at the beginning of the encounter.  Current Medications:  Current Outpatient Medications:    benzonatate  (TESSALON ) 100 MG capsule, Take 1 capsule (100 mg total) by mouth 3 (three) times daily as needed for cough., Disp: 30 capsule, Rfl: 0   promethazine -dextromethorphan (PROMETHAZINE -DM) 6.25-15 MG/5ML syrup, Take 5 mLs by mouth 4 (four) times daily as needed for cough., Disp: 118 mL, Rfl: 0   acetaminophen  (TYLENOL ) 500 MG tablet, Take 500 mg by mouth every 6 (six) hours as needed for moderate pain or headache., Disp: , Rfl:    diclofenac  (VOLTAREN ) 75 MG EC tablet, Take 1 tablet (75 mg total) by mouth 2 (two) times daily., Disp: 60 tablet, Rfl: 3   diltiazem  (CARDIZEM ) 30 MG tablet, Take 1 tablet (30 mg total) by mouth 4 (four) times daily as needed (for elevated heart rates)., Disp: 90 tablet, Rfl: 1   fexofenadine-pseudoephedrine (ALLEGRA-D 24) 180-240 MG 24 hr tablet, Take 1 tablet by mouth daily., Disp: , Rfl:    fluticasone  (FLONASE ) 50 MCG/ACT nasal spray, Place 2 sprays into both nostrils daily., Disp: 16 g, Rfl: 5   metoprolol  succinate (TOPROL -XL) 50 MG 24 hr tablet, Take 1 tablet (50 mg total) by mouth daily. Take with or immediately following a meal., Disp: 90 tablet, Rfl: 3   Medications ordered in this encounter:  Meds ordered this encounter  Medications   promethazine -dextromethorphan  (PROMETHAZINE -DM) 6.25-15 MG/5ML syrup    Sig: Take 5 mLs by mouth 4 (four) times daily as needed for cough.    Dispense:  118 mL    Refill:  0    Supervising Provider:   BLAISE ALEENE KIDD [[id number]]   benzonatate  (TESSALON ) 100 MG capsule    Sig: Take 1 capsule (100 mg total) by mouth 3 (three) times daily as needed for cough.    Dispense:  30 capsule    Refill:  0    Supervising Provider:   BLAISE ALEENE KIDD [[id number]]     *If you need refills on other medications prior to your next appointment, please contact your pharmacy*  Follow-Up: Call back or seek an in-person evaluation if the symptoms worsen or if the condition fails to improve as anticipated.  [facility] [phone]  Other Instructions  URI recommendations: - Increased rest - Increasing Fluids - Acetaminophen  / ibuprofen  as needed for fever/pain.  - Salt water gargling, chloraseptic spray and throat lozenges - Mucinex if mucus is present and increasing.  - Saline nasal spray if congestion or if nasal passages feel dry. - Humidifying the air.    If you have been instructed to have an in-person evaluation today at a local Urgent Care facility, please use the link below. It will take you to a list of all of our available [facility], including address, phone number and hours of operation. Please do not delay care.  [facility]  If you  or a family member do not have a primary care provider, use the link below to schedule a visit and establish care. When you choose a [facility] primary care physician or advanced practice provider, you gain a long-term partner in health. Find a Primary Care Provider  Learn more about [facility]'s in-office and virtual care options: [facility] - Get Care Now

## 2023-11-16 NOTE — Progress Notes (Signed)
 Virtual Visit Consent   Daniel Golden, you are scheduled for a virtual visit with a [facility] provider today. Just as with appointments in the office, your consent must be obtained to participate. Your consent will be active for this visit and any virtual visit you may have with one of our providers in the next 365 days. If you have a MyChart account, a copy of this consent can be sent to you electronically.  As this is a virtual visit, video technology does not allow for your provider to perform a traditional examination. This may limit your provider's ability to fully assess your condition. If your provider identifies any concerns that need to be evaluated in person or the need to arrange testing (such as labs, EKG, etc.), we will make arrangements to do so. Although advances in technology are sophisticated, we cannot ensure that it will always work on either your end or our end. If the connection with a video visit is poor, the visit may have to be switched to a telephone visit. With either a video or telephone visit, we are not always able to ensure that we have a secure connection.  By engaging in this virtual visit, you consent to the provision of healthcare and authorize for your insurance to be billed (if applicable) for the services provided during this visit. Depending on your insurance coverage, you may receive a charge related to this service.  I need to obtain your verbal consent now. Are you willing to proceed with your visit today? Daniel Golden has provided verbal consent on 11/16/2023 for a virtual visit (video or telephone). Chiquita [name], NP  Date: 11/16/2023 11:51 AM   Virtual Visit via Video Note   I, Chiquita [name], connected with  Daniel Golden  ([id number], 1991/02/15) on 11/16/23 at 11:45 AM EDT by a video-enabled telemedicine application and verified that I am speaking with the correct person using two identifiers.  Location: Patient: Virtual Visit Location  Patient: Home Provider: Virtual Visit Location Provider: Home Office   I discussed the limitations of evaluation and management by telemedicine and the availability of in person appointments. The patient expressed understanding and agreed to proceed.    History of Present Illness: Daniel Golden is a 33 y.o. who identifies as a male who was assigned male at birth, and is being seen today for sinus infection  Onset was yesterday with sore throat, and then congestion Associated symptoms are congestion progressed, aches and pain, runny nose, temp not taken, but feels feverish Modifying factors are tylenol - cough and cold syrup- helping some Denies chest pain, shortness of breath  Exposure to sick contacts- brother was sick with similar symptoms COVID test: not yet   Problems:  Patient Active Problem List   Diagnosis Date Noted   Esophageal dysphagia 08/03/2023   Chest pain of uncertain etiology    SVT (supraventricular tachycardia) (HCC)    Abnormal EKG    Palpitations 10/28/2018    Allergies: No Known Allergies Medications:  Current Outpatient Medications:    acetaminophen  (TYLENOL ) 500 MG tablet, Take 500 mg by mouth every 6 (six) hours as needed for moderate pain or headache., Disp: , Rfl:    diclofenac  (VOLTAREN ) 75 MG EC tablet, Take 1 tablet (75 mg total) by mouth 2 (two) times daily., Disp: 60 tablet, Rfl: 3   diltiazem  (CARDIZEM ) 30 MG tablet, Take 1 tablet (30 mg total) by mouth 4 (four) times daily as needed (for elevated heart rates)., Disp:  90 tablet, Rfl: 1   fexofenadine-pseudoephedrine (ALLEGRA-D 24) 180-240 MG 24 hr tablet, Take 1 tablet by mouth daily., Disp: , Rfl:    fluticasone  (FLONASE ) 50 MCG/ACT nasal spray, Place 2 sprays into both nostrils daily., Disp: 16 g, Rfl: 5   metoprolol  succinate (TOPROL -XL) 50 MG 24 hr tablet, Take 1 tablet (50 mg total) by mouth daily. Take with or immediately following a meal., Disp: 90 tablet, Rfl:  3  Observations/Objective: Patient is well-developed, well-nourished in no acute distress.  Resting comfortably  at home.  Head is normocephalic, atraumatic.  No labored breathing.  Speech is clear and coherent with logical content.  Patient is alert and oriented at baseline.    Assessment and Plan:   1. Viral URI with cough (Primary)  - promethazine -dextromethorphan (PROMETHAZINE -DM) 6.25-15 MG/5ML syrup; Take 5 mLs by mouth 4 (four) times daily as needed for cough.  Dispense: 118 mL; Refill: 0 - benzonatate  (TESSALON ) 100 MG capsule; Take 1 capsule (100 mg total) by mouth 3 (three) times daily as needed for cough.  Dispense: 30 capsule; Refill: 0   URI recommendations: - Increased rest - Increasing Fluids - Acetaminophen  / ibuprofen  as needed for fever/pain.  - Salt water gargling, chloraseptic spray and throat lozenges - Mucinex if mucus is present and increasing.  - Saline nasal spray if congestion or if nasal passages feel dry. - Humidifying the air.    Reviewed side effects, risks and benefits of medication.    Patient acknowledged agreement and understanding of the plan.   Past Medical, Surgical, Social History, Allergies, and Medications have been Reviewed.     Follow Up Instructions: I discussed the assessment and treatment plan with the patient. The patient was provided an opportunity to ask questions and all were answered. The patient agreed with the plan and demonstrated an understanding of the instructions.  A copy of instructions were sent to the patient via MyChart unless otherwise noted below.    The patient was advised to call back or seek an in-person evaluation if the symptoms worsen or if the condition fails to improve as anticipated.    Chiquita [name], NP

## 2024-01-22 DIAGNOSIS — F32A Depression, unspecified: Secondary | ICD-10-CM | POA: Diagnosis not present

## 2024-01-22 DIAGNOSIS — Z0000 Encounter for general adult medical examination without abnormal findings: Secondary | ICD-10-CM | POA: Diagnosis not present

## 2024-01-22 DIAGNOSIS — E559 Vitamin D deficiency, unspecified: Secondary | ICD-10-CM | POA: Diagnosis not present

## 2024-01-22 DIAGNOSIS — Z1159 Encounter for screening for other viral diseases: Secondary | ICD-10-CM | POA: Diagnosis not present

## 2024-01-22 DIAGNOSIS — Z1331 Encounter for screening for depression: Secondary | ICD-10-CM | POA: Diagnosis not present

## 2024-01-22 DIAGNOSIS — R0602 Shortness of breath: Secondary | ICD-10-CM | POA: Diagnosis not present

## 2024-01-22 DIAGNOSIS — Z1339 Encounter for screening examination for other mental health and behavioral disorders: Secondary | ICD-10-CM | POA: Diagnosis not present

## 2024-01-22 DIAGNOSIS — I471 Supraventricular tachycardia, unspecified: Secondary | ICD-10-CM | POA: Diagnosis not present

## 2024-01-22 DIAGNOSIS — Z131 Encounter for screening for diabetes mellitus: Secondary | ICD-10-CM | POA: Diagnosis not present

## 2024-01-28 ENCOUNTER — Ambulatory Visit (INDEPENDENT_AMBULATORY_CARE_PROVIDER_SITE_OTHER): Admitting: Family Medicine

## 2024-01-28 VITALS — BP 121/76 | HR 88 | Ht 69.0 in | Wt 153.2 lb

## 2024-01-28 DIAGNOSIS — Z130 Encounter for screening for diseases of the blood and blood-forming organs and certain disorders involving the immune mechanism: Secondary | ICD-10-CM

## 2024-01-28 DIAGNOSIS — Z13228 Encounter for screening for other metabolic disorders: Secondary | ICD-10-CM

## 2024-01-28 DIAGNOSIS — Z1329 Encounter for screening for other suspected endocrine disorder: Secondary | ICD-10-CM

## 2024-01-28 DIAGNOSIS — Z0000 Encounter for general adult medical examination without abnormal findings: Secondary | ICD-10-CM

## 2024-01-28 DIAGNOSIS — Z7689 Persons encountering health services in other specified circumstances: Secondary | ICD-10-CM

## 2024-01-28 DIAGNOSIS — Z136 Encounter for screening for cardiovascular disorders: Secondary | ICD-10-CM | POA: Diagnosis not present

## 2024-01-28 DIAGNOSIS — Z1159 Encounter for screening for other viral diseases: Secondary | ICD-10-CM

## 2024-01-28 NOTE — Progress Notes (Signed)
 New Patient Office Visit  Subjective    Patient ID: Daniel Golden, male    DOB: Jan 17, 1991  Age: 33 y.o. MRN: [id number]  CC:  Chief Complaint  Patient presents with   Establish Care    HPI QUINLIN CONANT presents to establish care and for routine annual exam. Patient has hypertension for which he takes meds and reports med compliance and denies acute complaints.    Outpatient Encounter Medications as of 01/28/2024  Medication Sig   acetaminophen  (TYLENOL ) 500 MG tablet Take 500 mg by mouth every 6 (six) hours as needed for moderate pain or headache.   benzonatate  (TESSALON ) 100 MG capsule Take 1 capsule (100 mg total) by mouth 3 (three) times daily as needed for cough.   diclofenac  (VOLTAREN ) 75 MG EC tablet Take 1 tablet (75 mg total) by mouth 2 (two) times daily.   diltiazem  (CARDIZEM ) 30 MG tablet Take 1 tablet (30 mg total) by mouth 4 (four) times daily as needed (for elevated heart rates).   fluticasone  (FLONASE ) 50 MCG/ACT nasal spray Place 2 sprays into both nostrils daily.   metoprolol  succinate (TOPROL -XL) 50 MG 24 hr tablet Take 1 tablet (50 mg total) by mouth daily. Take with or immediately following a meal.   promethazine -dextromethorphan (PROMETHAZINE -DM) 6.25-15 MG/5ML syrup Take 5 mLs by mouth 4 (four) times daily as needed for cough.   fexofenadine-pseudoephedrine (ALLEGRA-D 24) 180-240 MG 24 hr tablet Take 1 tablet by mouth daily.   [DISCONTINUED] famotidine  (PEPCID ) 20 MG tablet Take 1 tablet (20 mg total) by mouth 2 (two) times daily. (Patient taking differently: Take 20 mg by mouth 2 (two) times daily as needed for heartburn.)   No facility-administered encounter medications on file as of 01/28/2024.    Past Medical History:  Diagnosis Date   Headache     Past Surgical History:  Procedure Laterality Date   EYE SURGERY     HERNIA REPAIR     LEFT HEART CATH AND CORONARY ANGIOGRAPHY N/A 10/29/2018   Procedure: LEFT HEART CATH AND CORONARY ANGIOGRAPHY;   Surgeon: Mady Bruckner, MD;  Location: [facility];  Service: Cardiovascular;  Laterality: N/A;   SVT ABLATION N/A 12/24/2018   Procedure: SVT ABLATION;  Surgeon: Inocencio Soyla Lunger, MD;  Location: [facility];  Service: Cardiovascular;  Laterality: N/A;    Family History  Problem Relation Age of Onset   Diabetes Mother    Heart failure Mother    Diabetes Father    Heart failure Father     Social History   Socioeconomic History   Marital status: Single    Spouse name: Not on file   Number of children: Not on file   Years of education: Not on file   Highest education level: GED or equivalent  Occupational History   Not on file  Tobacco Use   Smoking status: Never    Passive exposure: Never   Smokeless tobacco: Never  Vaping Use   Vaping status: Never Used  Substance and Sexual Activity   Alcohol use: Yes    Comment: Occassionally.   Drug use: Never   Sexual activity: Not Currently  Other Topics Concern   Not on file  Social History Narrative   Not on file   Social Drivers of Health   Financial Resource Strain: Low Risk  (01/28/2024)   Overall Financial Resource Strain (CARDIA)    Difficulty of Paying Living Expenses: Not hard at all  Food Insecurity: No Food Insecurity (01/28/2024)  Hunger Vital Sign    Worried About Running Out of Food in the Last Year: Never true    Ran Out of Food in the Last Year: Never true  Transportation Needs: No Transportation Needs (01/28/2024)   PRAPARE - Administrator, Civil Service (Medical): No    Lack of Transportation (Non-Medical): No  Physical Activity: Inactive (01/28/2024)   Exercise Vital Sign    Days of Exercise per Week: 0 days    Minutes of Exercise per Session: Not on file  Stress: Stress Concern Present (01/28/2024)   [employer] of Occupational Health - Occupational Stress Questionnaire    Feeling of Stress: To some extent  Social Connections: Moderately Isolated (01/28/2024)   Social  Connection and Isolation Panel    Frequency of Communication with Friends and Family: Three times a week    Frequency of Social Gatherings with Friends and Family: Three times a week    Attends Religious Services: More than 4 times per year    Active Member of Clubs or Organizations: No    Attends Banker Meetings: Never    Marital Status: Never married  Intimate Partner Violence: Not At Risk (01/28/2024)   Humiliation, Afraid, Rape, and Kick questionnaire    Fear of Current or Ex-Partner: No    Emotionally Abused: No    Physically Abused: No    Sexually Abused: No    Review of Systems  All other systems reviewed and are negative.       Objective   BP 121/76   Pulse 88   Ht 5' 9 (1.753 m)   Wt 153 lb 3.2 oz (69.5 kg)   SpO2 98%   BMI 22.62 kg/m   Physical Exam Vitals and nursing note reviewed.  Constitutional:      General: He is not in acute distress. HENT:     Head: Normocephalic and atraumatic.     Right Ear: Tympanic membrane, ear canal and external ear normal.     Left Ear: Tympanic membrane, ear canal and external ear normal.     Nose: Nose normal.     Mouth/Throat:     Mouth: Mucous membranes are moist.     Pharynx: Oropharynx is clear.  Eyes:     Conjunctiva/sclera: Conjunctivae normal.     Pupils: Pupils are equal, round, and reactive to light.  Neck:     Thyroid : No thyromegaly.  Cardiovascular:     Rate and Rhythm: Normal rate and regular rhythm.     Heart sounds: Normal heart sounds. No murmur heard. Pulmonary:     Effort: Pulmonary effort is normal.     Breath sounds: Normal breath sounds.  Abdominal:     General: There is no distension.     Palpations: Abdomen is soft. There is no mass.     Tenderness: There is no abdominal tenderness.     Hernia: There is no hernia in the left inguinal area or right inguinal area.  Musculoskeletal:        General: Normal range of motion.     Cervical back: Normal range of motion and neck  supple.     Right lower leg: No edema.     Left lower leg: No edema.  Skin:    General: Skin is warm and dry.  Neurological:     General: No focal deficit present.     Mental Status: He is alert and oriented to person, place, and time. Mental status is at baseline.  Psychiatric:        Mood and Affect: Mood normal.        Behavior: Behavior normal.         Assessment & Plan:   Annual physical exam -     Comprehensive metabolic panel with GFR  Screening for deficiency anemia -     CBC with Differential/Platelet  Encounter for screening for cardiovascular disorders -     Lipid panel  Screening for endocrine/metabolic/immunity disorders -     Hemoglobin A1c -     VITAMIN D 25 Hydroxy (Vit-D Deficiency, Fractures)  Need for hepatitis C screening test -     Hepatitis C antibody  Encounter to establish care     No follow-ups on file.   Tanda Raguel SQUIBB, MD

## 2024-01-29 ENCOUNTER — Ambulatory Visit: Payer: Self-pay | Admitting: Family Medicine

## 2024-01-29 LAB — COMPREHENSIVE METABOLIC PANEL WITH GFR
ALT: 11 IU/L (ref 0–44)
AST: 14 IU/L (ref 0–40)
Albumin: 4.7 g/dL (ref 4.1–5.1)
Alkaline Phosphatase: 73 IU/L (ref 47–123)
BUN/Creatinine Ratio: 10 (ref 9–20)
BUN: 11 mg/dL (ref 6–20)
Bilirubin Total: 0.5 mg/dL (ref 0.0–1.2)
CO2: 25 mmol/L (ref 20–29)
Calcium: 10.1 mg/dL (ref 8.7–10.2)
Chloride: 103 mmol/L (ref 96–106)
Creatinine, Ser: 1.1 mg/dL (ref 0.76–1.27)
Globulin, Total: 2.5 g/dL (ref 1.5–4.5)
Glucose: 74 mg/dL (ref 70–99)
Potassium: 4.4 mmol/L (ref 3.5–5.2)
Sodium: 143 mmol/L (ref 134–144)
Total Protein: 7.2 g/dL (ref 6.0–8.5)
eGFR: 92 mL/min/1.73 (ref >59)

## 2024-01-29 LAB — CBC WITH DIFFERENTIAL/PLATELET
Basophils Absolute: 0 x10E3/uL (ref 0.0–0.2)
Basos: 1 %
EOS (ABSOLUTE): 0.1 x10E3/uL (ref 0.0–0.4)
Eos: 2 %
Hematocrit: 48.1 % (ref 37.5–51.0)
Hemoglobin: 15.3 g/dL (ref 13.0–17.7)
Immature Grans (Abs): 0 x10E3/uL (ref 0.0–0.1)
Immature Granulocytes: 0 %
Lymphocytes Absolute: 1.7 x10E3/uL (ref 0.7–3.1)
Lymphs: 31 %
MCH: 26.9 pg (ref 26.6–33.0)
MCHC: 31.8 g/dL (ref 31.5–35.7)
MCV: 85 fL (ref 79–97)
Monocytes Absolute: 0.7 x10E3/uL (ref 0.1–0.9)
Monocytes: 14 %
Neutrophils Absolute: 2.8 x10E3/uL (ref 1.4–7.0)
Neutrophils: 52 %
Platelets: 220 x10E3/uL (ref 150–450)
RBC: 5.69 x10E6/uL (ref 4.14–5.80)
RDW: 11.9 % (ref 11.6–15.4)
WBC: 5.3 x10E3/uL (ref 3.4–10.8)

## 2024-01-29 LAB — LIPID PANEL
Chol/HDL Ratio: 2.7 ratio (ref 0.0–5.0)
Cholesterol, Total: 116 mg/dL (ref 100–199)
HDL: 43 mg/dL (ref >39)
LDL Chol Calc (NIH): 59 mg/dL (ref 0–99)
Triglycerides: 67 mg/dL (ref 0–149)
VLDL Cholesterol Cal: 14 mg/dL (ref 5–40)

## 2024-01-29 LAB — HEMOGLOBIN A1C
Est. average glucose Bld gHb Est-mCnc: 105 mg/dL
Hgb A1c MFr Bld: 5.3 % (ref 4.8–5.6)

## 2024-01-29 LAB — HEPATITIS C ANTIBODY: Hep C Virus Ab: NONREACTIVE

## 2024-01-29 LAB — VITAMIN D 25 HYDROXY (VIT D DEFICIENCY, FRACTURES): Vit D, 25-Hydroxy: 22.2 ng/mL — ABNORMAL LOW (ref 30.0–100.0)

## 2024-01-29 MED ORDER — VITAMIN D (ERGOCALCIFEROL) 1.25 MG (50000 UNIT) PO CAPS: 50000.0000 [IU] | ORAL_CAPSULE | ORAL | 0 refills | Status: AC

## 2024-04-02 ENCOUNTER — Telehealth

## 2024-04-02 DIAGNOSIS — J029 Acute pharyngitis, unspecified: Secondary | ICD-10-CM | POA: Diagnosis not present

## 2024-04-02 MED ORDER — AMOXICILLIN 875 MG PO TABS: 875.0000 mg | ORAL_TABLET | Freq: Two times a day (BID) | ORAL | 0 refills | Status: AC

## 2024-04-02 NOTE — Patient Instructions (Signed)

## 2024-04-02 NOTE — Progress Notes (Signed)
 " Virtual Visit Consent   Daniel Golden, you are scheduled for a virtual visit with a [facility] provider today. Just as with appointments in the office, your consent must be obtained to participate. Your consent will be active for this visit and any virtual visit you may have with one of our providers in the next 365 days. If you have a MyChart account, a copy of this consent can be sent to you electronically.  As this is a virtual visit, video technology does not allow for your provider to perform a traditional examination. This may limit your provider's ability to fully assess your condition. If your provider identifies any concerns that need to be evaluated in person or the need to arrange testing (such as labs, EKG, etc.), we will make arrangements to do so. Although advances in technology are sophisticated, we cannot ensure that it will always work on either your end or our end. If the connection with a video visit is poor, the visit may have to be switched to a telephone visit. With either a video or telephone visit, we are not always able to ensure that we have a secure connection.  By engaging in this virtual visit, you consent to the provision of healthcare and authorize for your insurance to be billed (if applicable) for the services provided during this visit. Depending on your insurance coverage, you may receive a charge related to this service.  I need to obtain your verbal consent now. Are you willing to proceed with your visit today? Daniel Golden has provided verbal consent on 04/02/2024 for a virtual visit (video or telephone). [name], FNP  Date: 04/02/2024 10:12 AM   Virtual Visit via Video Note   I, [name], connected with  Daniel Golden  ([id number], February 14, 1991) on 04/02/2024 at 10:15 AM EST by a video-enabled telemedicine application and verified that I am speaking with the correct person using two identifiers.  Location: Patient: Virtual Visit Location Patient:  Home Provider: Virtual Visit Location Provider: Home Office   I discussed the limitations of evaluation and management by telemedicine and the availability of in person appointments. The patient expressed understanding and agreed to proceed.    History of Present Illness: Daniel Golden is a 34 y.o. who identifies as a male who was assigned male at birth, and is being seen today for sore throat off and on for years worsening over the past week, mucus in throat sore throat breathing and swallowing normal. No fever, nausea or headaches. In no distress.   HPI: HPI  Problems:  Patient Active Problem List   Diagnosis Date Noted   Esophageal dysphagia 08/03/2023   Chest pain of uncertain etiology    SVT (supraventricular tachycardia)    Abnormal EKG    Palpitations 10/28/2018    Allergies: Allergies[1] Medications: Current Medications[2]  Observations/Objective: Patient is well-developed, well-nourished in no acute distress.  Resting comfortably  at home.  Head is normocephalic, atraumatic.  No labored breathing.  Speech is clear and coherent with logical content.  Patient is alert and oriented at baseline.    Assessment and Plan: 1. Pharyngitis, unspecified etiology (Primary)  Increase fluids, humidifier at night, tylenol  or ibuprofen  as directed, UC if sx worsen.   Follow Up Instructions: I discussed the assessment and treatment plan with the patient. The patient was provided an opportunity to ask questions and all were answered. The patient agreed with the plan and demonstrated an understanding of the instructions.  A copy  of instructions were sent to the patient via MyChart unless otherwise noted below.     The patient was advised to call back or seek an in-person evaluation if the symptoms worsen or if the condition fails to improve as anticipated.    Gibran Veselka, FNP     [1] No Known Allergies [2]  Current Outpatient Medications:    amoxicillin  (AMOXIL ) 875 MG  tablet, Take 1 tablet (875 mg total) by mouth 2 (two) times daily for 10 days., Disp: 20 tablet, Rfl: 0   acetaminophen  (TYLENOL ) 500 MG tablet, Take 500 mg by mouth every 6 (six) hours as needed for moderate pain or headache., Disp: , Rfl:    benzonatate  (TESSALON ) 100 MG capsule, Take 1 capsule (100 mg total) by mouth 3 (three) times daily as needed for cough., Disp: 30 capsule, Rfl: 0   diclofenac  (VOLTAREN ) 75 MG EC tablet, Take 1 tablet (75 mg total) by mouth 2 (two) times daily., Disp: 60 tablet, Rfl: 3   diltiazem  (CARDIZEM ) 30 MG tablet, Take 1 tablet (30 mg total) by mouth 4 (four) times daily as needed (for elevated heart rates)., Disp: 90 tablet, Rfl: 1   fexofenadine-pseudoephedrine (ALLEGRA-D 24) 180-240 MG 24 hr tablet, Take 1 tablet by mouth daily., Disp: , Rfl:    fluticasone  (FLONASE ) 50 MCG/ACT nasal spray, Place 2 sprays into both nostrils daily., Disp: 16 g, Rfl: 5   metoprolol  succinate (TOPROL -XL) 50 MG 24 hr tablet, Take 1 tablet (50 mg total) by mouth daily. Take with or immediately following a meal., Disp: 90 tablet, Rfl: 3   promethazine -dextromethorphan (PROMETHAZINE -DM) 6.25-15 MG/5ML syrup, Take 5 mLs by mouth 4 (four) times daily as needed for cough., Disp: 118 mL, Rfl: 0   Vitamin D , Ergocalciferol , (DRISDOL ) 1.25 MG (50000 UNIT) CAPS capsule, Take 1 capsule (50,000 Units total) by mouth every 7 (seven) days., Disp: 12 capsule, Rfl: 0  "

## 2025-01-27 ENCOUNTER — Encounter: Admitting: Family Medicine
